# Patient Record
Sex: Female | Born: 1960 | Race: White | Hispanic: No | Smoking: Former smoker
Health system: Southern US, Community
[De-identification: ages and names within clinical notes are randomized; demographics above are authoritative.]

## PROBLEM LIST (undated history)

## (undated) DIAGNOSIS — I10 Essential (primary) hypertension: Secondary | ICD-10-CM

## (undated) DIAGNOSIS — D490 Neoplasm of unspecified behavior of digestive system: Secondary | ICD-10-CM

## (undated) DIAGNOSIS — E785 Hyperlipidemia, unspecified: Secondary | ICD-10-CM

## (undated) HISTORY — DX: Hyperlipidemia, unspecified: E78.5

## (undated) HISTORY — PX: BUNIONECTOMY: SHX129

## (undated) HISTORY — PX: ABDOMINAL HYSTERECTOMY: SHX81

---

## 2001-12-26 ENCOUNTER — Ambulatory Visit (HOSPITAL_COMMUNITY): Admission: RE | Admit: 2001-12-26 | Discharge: 2001-12-26 | Payer: Self-pay | Admitting: Pediatrics

## 2001-12-26 ENCOUNTER — Encounter: Payer: Self-pay | Admitting: Obstetrics and Gynecology

## 2005-04-12 ENCOUNTER — Observation Stay (HOSPITAL_COMMUNITY): Admission: RE | Admit: 2005-04-12 | Discharge: 2005-04-13 | Payer: Self-pay | Admitting: Obstetrics & Gynecology

## 2005-04-12 ENCOUNTER — Encounter (INDEPENDENT_AMBULATORY_CARE_PROVIDER_SITE_OTHER): Payer: Self-pay | Admitting: *Deleted

## 2007-05-12 ENCOUNTER — Ambulatory Visit (HOSPITAL_COMMUNITY): Admission: RE | Admit: 2007-05-12 | Discharge: 2007-05-12 | Payer: Self-pay | Admitting: Obstetrics and Gynecology

## 2010-04-10 ENCOUNTER — Ambulatory Visit (HOSPITAL_COMMUNITY): Admission: RE | Admit: 2010-04-10 | Discharge: 2010-04-10 | Payer: Self-pay | Admitting: Obstetrics and Gynecology

## 2010-10-26 ENCOUNTER — Emergency Department (HOSPITAL_COMMUNITY)
Admission: EM | Admit: 2010-10-26 | Discharge: 2010-10-26 | Payer: Self-pay | Source: Home / Self Care | Admitting: Emergency Medicine

## 2011-03-02 NOTE — H&P (Signed)
NAME:  [name] [name], [name] [name] NO.:  [id number]   MEDICAL RECORD NO.:  [id number]          PATIENT TYPE:  AMB   LOCATION:  SDC                           FACILITY:  [facility]   PHYSICIAN:  Luvenia Redden, M.D.   DATE OF BIRTH:  1961/06/17   DATE OF ADMISSION:  04/12/2005  DATE OF DISCHARGE:                                HISTORY & PHYSICAL   This patient is a 50 year old, gravida 1, para 1 admitted to the hospital  for a vaginal hysterectomy.   HISTORY OF PRESENT ILLNESS:  She has had a long history of abnormal uterine  bleeding which is characterized by very heavy periods, clotting, and periods  which sometimes last in excess of a week to 10 days. She was referred to [place]  by Dr. Elana Alm who previously had attempted conservative management. She had  a D&C and hysteroscopy which was done about a year ago. She was placed on  cyclic hormones. She continued to have heavy long periods. Recently she had  an endometrial biopsy which was benign. She had a pelvic ultrasound which  showed 2 or 3 small uterine myomata, one of which was impinging on the  endometrial cavity up in the fundus. She was given Lupron 11.25 mg in May of  2006 but she has continued to bleed and at times has been heavy. She was  referred to [place] for a possible hysterectomy to correct this bleeding problem.  She is now admitted for that purpose.   PAST MEDICAL HISTORY:  The patient had the usual childhood diseases, she has  had no serious medical illnesses. She had one pregnancy which progressed to  term, she has one living child. No history of bleeding disorders, previous  surgeries, psychological disorders. She does not smoke tobacco products. The  only medication she is taking at the time of admission is iron and also she  was taking some __________ when needed for heavy flow. She has no known drug  allergies except for PENICILLIN. The only medication she is currently taking  is Ortho-Cyclen to try to control her  bleeding.   FAMILY HISTORY:  There is no family history of bleeding disorders, carcinoma  or heart disease.   REVIEW OF SYMPTOMS:  __________: No complaints. CARDIORESPIRATORY:  No  complaints. GI:  No complaints. GU:  See present illness. NEUROMUSCULAR:  No  complaints.   PHYSICAL EXAMINATION:  GENERAL:  Well-developed, 50 year old female in no  acute distress.  VITAL SIGNS:  Temperature is 98.6, pulse is 76, respirations are 14 per  minute, blood pressure is 100/60.  HEAD/NECK:  Show pupils equal and reactive to light and accommodation. Ears,  nose and throat are normal. Thyroid is normal size.  HEART:  Rhythm regular, no murmurs heard.  LUNGS:  Clear to auscultation and percussion.  BREASTS:  No palpable masses.  ABDOMEN:  No palpable masses.  SKIN:  Smooth and dry.  EXTREMITIES:  Show no deformities. In her lower extremities, she has  bilateral __________ some of her digits. Peripheral pulses are present.  NEUROLOGIC:  Physiologic.  PELVIC:  External genitalia is  normal with a parous outlet. The bladder and  rectum are fairly well supported. On straining, the cervix comes down nearly  to the introitus. The cervix is smooth. Pap smear done nine months ago was  normal. The uterus itself is anterior, it is upper limits of normal size. I  cannot really feel any irregularities. There are no adnexal masses.   IMPRESSION:  1.  Multiple small uterine myomata.  2.  Menorrhagia.  3.  Anemia secondary to the menorrhagia. Last hemoglobin was 8.1 in the      office.  4.  Uterine prolapse.   PLAN:  The patient will be taken to the hospital where a vaginal  hysterectomy will be done without removing her ovaries. We plan to conserve  her ovaries unless there is some __________. She understands the possible  complications of pelvic surgery not limited to bleeding, infection, injury  to adjacent structures and death. The patient agrees to undergo this  procedure.       WSB/MEDQ  D:   04/12/2005  T:  04/12/2005  Job:  [id number]

## 2011-03-02 NOTE — Discharge Summary (Signed)
NAMETORINA, EY                ACCOUNT NO.:  [id number]   MEDICAL RECORD NO.:  [id number]          PATIENT TYPE:  OBV   LOCATION:  9305                          [facility]ACILITY:  [facility]   PHYSICIAN:  Gerrit Friends. Aldona Bar, M.D.   DATE OF BIRTH:  18-Mar-1961   DATE OF ADMISSION:  04/12/2005  DATE OF DISCHARGE:  04/13/2005                                 DISCHARGE SUMMARY   DISCHARGE DIAGNOSIS:  Menorrhagia, uterine fibroids and anemia, pathology  pending.   PROCEDURE:  Total vaginal hysterectomy.   SUMMARY:  This 50 year old patient was referred to Dr. Lodema Hong by Dr.  Kyra Manges for consideration of a vaginal hysterectomy. For details,  please see history and physical admitting note. Once the patient was seen by  Dr. Greta Doom, he asked me to carry out the vaginal hysterectomy with his  assistance. The patient's preoperative evaluation was negative with the  exception of anemia which was known and related to her bleeding. Her  admitting hemoglobin was 9.7 with a white count of 6800. Her evaluation  otherwise was unremarkable with the exception of a known fibroid uterus.   She was taken to the operating room on April 12, 2005 at which time she  underwent a total vaginal hysterectomy without incident. Her postoperative  hemoglobin was 9.9 with a white count of 11,900 and a platelet count of  427,000. At 1:00 p.m. on April 13, 2005, approximately 23 hours after  surgery, her vital signs were stable. She was comfortable. Oral analgesia  was effective. She was having bowel function and tolerating her diet well  and voiding without difficulty. She was desirous of discharge and  accordingly, was discharged to home.   DISCHARGE MEDICATIONS:  1.  Include iron one daily as she was doing preoperatively.  2.  Motrin 600 mg every six hours as needed for pain.  3.  Tylox one to two every four to six hours as needed for more severe pain.   Pathologic specimen is still pending at the time of discharge.   CONDITION ON DISCHARGE:  Improved.   FOLLOW-UP:  In the office will be carried out in approximately three weeks'  time or as needed. The patient was given all appropriate instructions at the  time of discharge.       RMW/MEDQ  D:  04/13/2005  T:  04/13/2005  Job:  [id number]   cc:   S. Kyra Manges, M.D.  [phone] [address]  [place]  [place] [address]  Fax: [phone]

## 2011-03-02 NOTE — Op Note (Signed)
[name] [name], [name] [name]                ACCOUNT NO.:  [id number]   MEDICAL RECORD NO.:  [id number]          PATIENT TYPE:  OBV   LOCATION:  9399                          [facility]   PHYSICIAN:  Gerrit Friends. Aldona Bar, M.D.   DATE OF BIRTH:  1961/02/04   DATE OF PROCEDURE:  04/12/2005  DATE OF DISCHARGE:                                 OPERATIVE REPORT   PREOPERATIVE DIAGNOSES:  1.  Menorrhagia.  2.  Fibroids.  3.  Anemia   POSTOPERATIVE DIAGNOSES:  1.  Menorrhagia.  2.  Fibroids.  3.  Anemia  4.  Pathology pending.   PROCEDURE:  Total vaginal hysterectomy.   SURGEON:  Gerrit Friends. Aldona Bar, M.D.   ASSISTANT:  Dr. Lodema Hong.   ANESTHESIA:  General endotracheal.   PROCEDURE:  The patient was taken to the operating room where after  satisfactory induction of general endotracheal anesthesia, she was prepped  and draped having been placed in the lithotomy position in the short Allen  stirrups.  The bladder was drained of clear urine with a red rubber catheter  in in-and-out fashion as part of the prep.   After the patient was adequately draped, the procedure was begun.  A  posterior weighted speculum was placed and a tenaculum placed on the cervix.  The cervix pulled down easily to almost the level of the introitus.  Thereafter the cervix was circumferentially injected with a dilute solution  of Neo-Synephrine for hemostasis.  Thereafter the cervix and the vagina were  sharply separated by using a knife in a circumferential fashion.  Posterior  peritoneum was identified and entered appropriately.  The back of the uterus  was palpated.  There were some fibroids noted in the fundal region.  At this  time the uterosacral pedicles were taken using curved Heaney clamps and  suture ligature of - Vicryl suture.  Additional parametrial bites were taken bilaterally in a similar fashion.  The anterior flap was dissected somewhat, and at this time the uterine  artery pedicles were clamped, cut and  suture secured with 0 Vicryl suture  bilaterally.  An additional parametrial bite was taken bilaterally and then  with relative ease, the anterior peritoneum was entered and bowel was seen  appropriately.  At this time an additional parametrial pedicle was taken  bilaterally and specimen thereafter inverted.  The ovarian pedicles were  then clamped, cut, specimen removed, and the ovarian pedicles were doubly  suture secured with 0 Vicryl suture.  Hemostasis was noted to be adequate  with the exception of the anterior and posterior cuff.  All pedicles seemed  secure.   At this time the posterior cuff was secured using a running locking suture  from uterosacral to uterosacral.  Thereafter the peritoneum was closed in a  pursestring fashion, extraperitonealizing all vascular pedicles with the  exception of the ovarian pedicles, which were cut free.  At this time  closure of vaginal cuff was carried out in a vertical fashion using  interrupted 0 Vicryl suture.  It is of note that prior to closure of the  peritoneum, the bladder  was drained of clear urine with a catheter.   After the vaginal cuff was closed in vertical fashion using interrupted 0  Vicryl suture, the procedure was felt to be complete and was terminated.  Estimated blood loss of approximately 150 mL.  All counts correct x2.  The  patient was transported to the recovery room in satisfactory, having  tolerated well.   In summary, this patient presented with menorrhagia, anemia and a fibroid  uterus and after appropriate workup was deemed an excellent candidate for a  total vaginal hysterectomy, which was carried out without difficulty.  Pathologic findings consisted of multiple fibroids, microscopic findings  pending.       RMW/MEDQ  D:  04/12/2005  T:  04/12/2005  Job:  [id number]

## 2011-03-02 NOTE — Discharge Summary (Signed)
NAMEMARNE, [name]                ACCOUNT NO.:  [id number]   MEDICAL RECORD NO.:  [id number]          PATIENT TYPE:  OBV   LOCATION:  9305                          [facility]   PHYSICIAN:  Gerrit Friends. Aldona Bar, M.D.   DATE OF BIRTH:  03-27-1961   DATE OF ADMISSION:  04/12/2005  DATE OF DISCHARGE:                                 DISCHARGE SUMMARY   Audio too short to transcribe (less than 5 seconds)       RMW/MEDQ  D:  04/13/2005  T:  04/13/2005  Job:  [id number]

## 2011-09-17 ENCOUNTER — Other Ambulatory Visit (HOSPITAL_COMMUNITY): Payer: Self-pay | Admitting: Family Medicine

## 2011-09-17 DIAGNOSIS — Z139 Encounter for screening, unspecified: Secondary | ICD-10-CM

## 2011-11-12 ENCOUNTER — Ambulatory Visit (HOSPITAL_COMMUNITY)
Admission: RE | Admit: 2011-11-12 | Discharge: 2011-11-12 | Disposition: A | Discharge status: Home / Self Care | Payer: 59 | Source: Ambulatory Visit | Attending: Family Medicine | Admitting: Family Medicine

## 2011-11-12 DIAGNOSIS — Z1231 Encounter for screening mammogram for malignant neoplasm of breast: Secondary | ICD-10-CM | POA: Insufficient documentation

## 2011-11-12 DIAGNOSIS — Z139 Encounter for screening, unspecified: Secondary | ICD-10-CM

## 2013-03-10 ENCOUNTER — Other Ambulatory Visit (HOSPITAL_COMMUNITY)
Admission: RE | Admit: 2013-03-10 | Discharge: 2013-03-10 | Disposition: A | Discharge status: Home / Self Care | Payer: BC Managed Care – PPO | Source: Ambulatory Visit | Attending: Otolaryngology | Admitting: Otolaryngology

## 2013-03-10 DIAGNOSIS — K119 Disease of salivary gland, unspecified: Secondary | ICD-10-CM | POA: Insufficient documentation

## 2013-03-11 ENCOUNTER — Other Ambulatory Visit: Payer: Self-pay | Admitting: Otolaryngology

## 2013-03-11 DIAGNOSIS — D37039 Neoplasm of uncertain behavior of the major salivary glands, unspecified: Secondary | ICD-10-CM

## 2013-03-17 ENCOUNTER — Ambulatory Visit
Admission: RE | Admit: 2013-03-17 | Discharge: 2013-03-17 | Disposition: A | Discharge status: Home / Self Care | Payer: BC Managed Care – PPO | Source: Ambulatory Visit | Attending: Otolaryngology | Admitting: Otolaryngology

## 2013-03-17 DIAGNOSIS — D37039 Neoplasm of uncertain behavior of the major salivary glands, unspecified: Secondary | ICD-10-CM

## 2013-03-17 MED ORDER — IOHEXOL 300 MG/ML  SOLN
75.0000 mL | Freq: Once | INTRAMUSCULAR | Status: AC | PRN
  Administered 2013-03-17: 75 mL via INTRAVENOUS

## 2013-04-10 ENCOUNTER — Other Ambulatory Visit (HOSPITAL_COMMUNITY): Payer: Self-pay | Admitting: Family Medicine

## 2013-04-10 DIAGNOSIS — Z139 Encounter for screening, unspecified: Secondary | ICD-10-CM

## 2013-05-01 ENCOUNTER — Ambulatory Visit (HOSPITAL_COMMUNITY)
Admission: RE | Admit: 2013-05-01 | Discharge: 2013-05-01 | Disposition: A | Discharge status: Home / Self Care | Payer: BC Managed Care – PPO | Source: Ambulatory Visit | Attending: Family Medicine | Admitting: Family Medicine

## 2013-05-01 DIAGNOSIS — Z139 Encounter for screening, unspecified: Secondary | ICD-10-CM

## 2013-05-01 DIAGNOSIS — Z1231 Encounter for screening mammogram for malignant neoplasm of breast: Secondary | ICD-10-CM | POA: Insufficient documentation

## 2014-01-01 ENCOUNTER — Ambulatory Visit: Payer: Self-pay

## 2014-01-04 ENCOUNTER — Ambulatory Visit: Payer: Self-pay

## 2014-01-08 ENCOUNTER — Ambulatory Visit (INDEPENDENT_AMBULATORY_CARE_PROVIDER_SITE_OTHER): Payer: BC Managed Care – PPO

## 2014-01-08 VITALS — BP 136/81 | HR 63 | Resp 16 | Ht 62.0 in | Wt 140.0 lb

## 2014-01-08 DIAGNOSIS — M2010 Hallux valgus (acquired), unspecified foot: Secondary | ICD-10-CM

## 2014-01-08 DIAGNOSIS — M79673 Pain in unspecified foot: Secondary | ICD-10-CM

## 2014-01-08 DIAGNOSIS — B351 Tinea unguium: Secondary | ICD-10-CM

## 2014-01-08 DIAGNOSIS — M79609 Pain in unspecified limb: Secondary | ICD-10-CM

## 2014-01-08 DIAGNOSIS — M779 Enthesopathy, unspecified: Secondary | ICD-10-CM

## 2014-01-08 DIAGNOSIS — Z79899 Other long term (current) drug therapy: Secondary | ICD-10-CM

## 2014-01-08 MED ORDER — TERBINAFINE HCL 250 MG PO TABS: 250.0000 mg | ORAL_TABLET | Freq: Every day | ORAL | Status: DC

## 2014-01-08 NOTE — Patient Instructions (Signed)

## 2014-01-08 NOTE — Progress Notes (Signed)
   Subjective:    Patient ID: [name] [name], female    DOB: 1961-03-27, 53 y.o.   MRN: [id number]  HPI Comments: "I have nail fungus and the right foot hurts"  Patient c/o toenail discoloration and thickening bilateral toenails for a few years. Her PCP Rx'd Lamisil about 1 year ago and took 3 months worth. She hasn't seen too much improvements.  Also, c/o aching 1st MPJ right foot for several years, just recently more painful. The area does feel swollen sometimes. Shoes can be uncomfortable.     Review of Systems  All other systems reviewed and are negative.       Objective:   Physical Exam Neurovascular status is intact pedal pulses palpable DP and PT posterior were for Refill timed 3-4 seconds skin temperature warm turgor normal no edema rubor or varicosities noted neurologically epicritic and proprioceptive sensations intact and symmetric bilateral. Orthopedic biomechanical exam reveals notable bunion deformity with lateral deviation of hallux I am angle 13 on x-ray sesamoid position 6 with adductovarus rotated hammertoes 2 through 5 on the right foot as well. Left foot has bunion although not as severe. Patient also has a congenital deformities of both hands that are noted there deformities noted on the foot this time. no open wounds ulcerations patient also recently treated the past year with topical in the oral antifungal lamisil for fungus nails are starting to have improvement palmar stop the medication and since then the nails cut again risks resumed having fungus and white discoloration patient is interested in resuming her medication treatment for fungus with lamisil. Discussed possibly the topical however patient patient is not able to topical and she has to get that scheduled time following through on the As far as the bunion goes it has some mild pain tenderness I was worse and last for weakness and some of them they have worsened different shoes and works as a Electrical engineer and  may been excessively working for couple days aggravated her foot since this time is calm down and she wearing more comminuted shoe.      Assessment & Plan:  Assessment this time hallux abductovalgus deformity First MTP Area Literature about Bunions and Possible Bunion Surgery Correction Is Given in the Interim Use NSAIDs and Accommodative Shoe Therapies. Is Also Given a Prescription Called in for Lamisil Are Benefiting 20 Mg Tablets 1 Daily for 83 Months Duration However [facility] to Be Done within 30 Days for Hepatic Functions to Assess the Lamisil Special Side Effects or Liver. Will Recheck in 3-6 Months for Nail Check in 10 Growth Monitoring for Improvement May Be K. for Booster Dose of 6 Months. Patient Will Find Contact us at [facility] Future When She Is Ready for Bunion Surgery If Symptoms Recur  Harriet Masson DPM

## 2014-07-20 ENCOUNTER — Other Ambulatory Visit (HOSPITAL_COMMUNITY): Payer: Self-pay | Admitting: Family Medicine

## 2014-07-20 DIAGNOSIS — Z1231 Encounter for screening mammogram for malignant neoplasm of breast: Secondary | ICD-10-CM

## 2014-08-13 ENCOUNTER — Ambulatory Visit (HOSPITAL_COMMUNITY)
Admission: RE | Admit: 2014-08-13 | Discharge: 2014-08-13 | Disposition: A | Discharge status: Home / Self Care | Payer: 59 | Source: Ambulatory Visit | Attending: Family Medicine | Admitting: Family Medicine

## 2014-08-13 DIAGNOSIS — Z1231 Encounter for screening mammogram for malignant neoplasm of breast: Secondary | ICD-10-CM | POA: Diagnosis not present

## 2014-08-16 ENCOUNTER — Other Ambulatory Visit: Payer: Self-pay | Admitting: Family Medicine

## 2014-08-16 DIAGNOSIS — R928 Other abnormal and inconclusive findings on diagnostic imaging of breast: Secondary | ICD-10-CM

## 2014-08-31 ENCOUNTER — Ambulatory Visit (HOSPITAL_COMMUNITY)
Admission: RE | Admit: 2014-08-31 | Discharge: 2014-08-31 | Disposition: A | Discharge status: Home / Self Care | Payer: 59 | Source: Ambulatory Visit | Attending: Family Medicine | Admitting: Family Medicine

## 2014-08-31 DIAGNOSIS — R928 Other abnormal and inconclusive findings on diagnostic imaging of breast: Secondary | ICD-10-CM | POA: Insufficient documentation

## 2015-07-04 DIAGNOSIS — L821 Other seborrheic keratosis: Secondary | ICD-10-CM | POA: Insufficient documentation

## 2015-07-04 DIAGNOSIS — M545 Low back pain, unspecified: Secondary | ICD-10-CM | POA: Insufficient documentation

## 2015-07-04 DIAGNOSIS — Z23 Encounter for immunization: Secondary | ICD-10-CM | POA: Insufficient documentation

## 2015-07-04 DIAGNOSIS — M279 Disease of jaws, unspecified: Secondary | ICD-10-CM | POA: Insufficient documentation

## 2015-07-04 DIAGNOSIS — F17210 Nicotine dependence, cigarettes, uncomplicated: Secondary | ICD-10-CM | POA: Insufficient documentation

## 2015-07-04 DIAGNOSIS — L918 Other hypertrophic disorders of the skin: Secondary | ICD-10-CM | POA: Insufficient documentation

## 2015-07-04 DIAGNOSIS — Z79890 Hormone replacement therapy (postmenopausal): Secondary | ICD-10-CM | POA: Insufficient documentation

## 2015-07-04 DIAGNOSIS — F419 Anxiety disorder, unspecified: Secondary | ICD-10-CM | POA: Insufficient documentation

## 2015-07-04 DIAGNOSIS — M722 Plantar fascial fibromatosis: Secondary | ICD-10-CM | POA: Insufficient documentation

## 2015-07-04 DIAGNOSIS — B351 Tinea unguium: Secondary | ICD-10-CM | POA: Insufficient documentation

## 2015-07-12 IMAGING — MG MM DIGITAL DIAGNOSTIC UNILAT R
2 series · 2 of 2 positions shown · non-contrast
Comparison: 08/13/2014 and earlier

CLINICAL DATA: The patient returns after screening study for
evaluation of the right breast.

EXAM:
DIGITAL DIAGNOSTIC  RIGHT MAMMOGRAM WITH CAD

[R MLO]
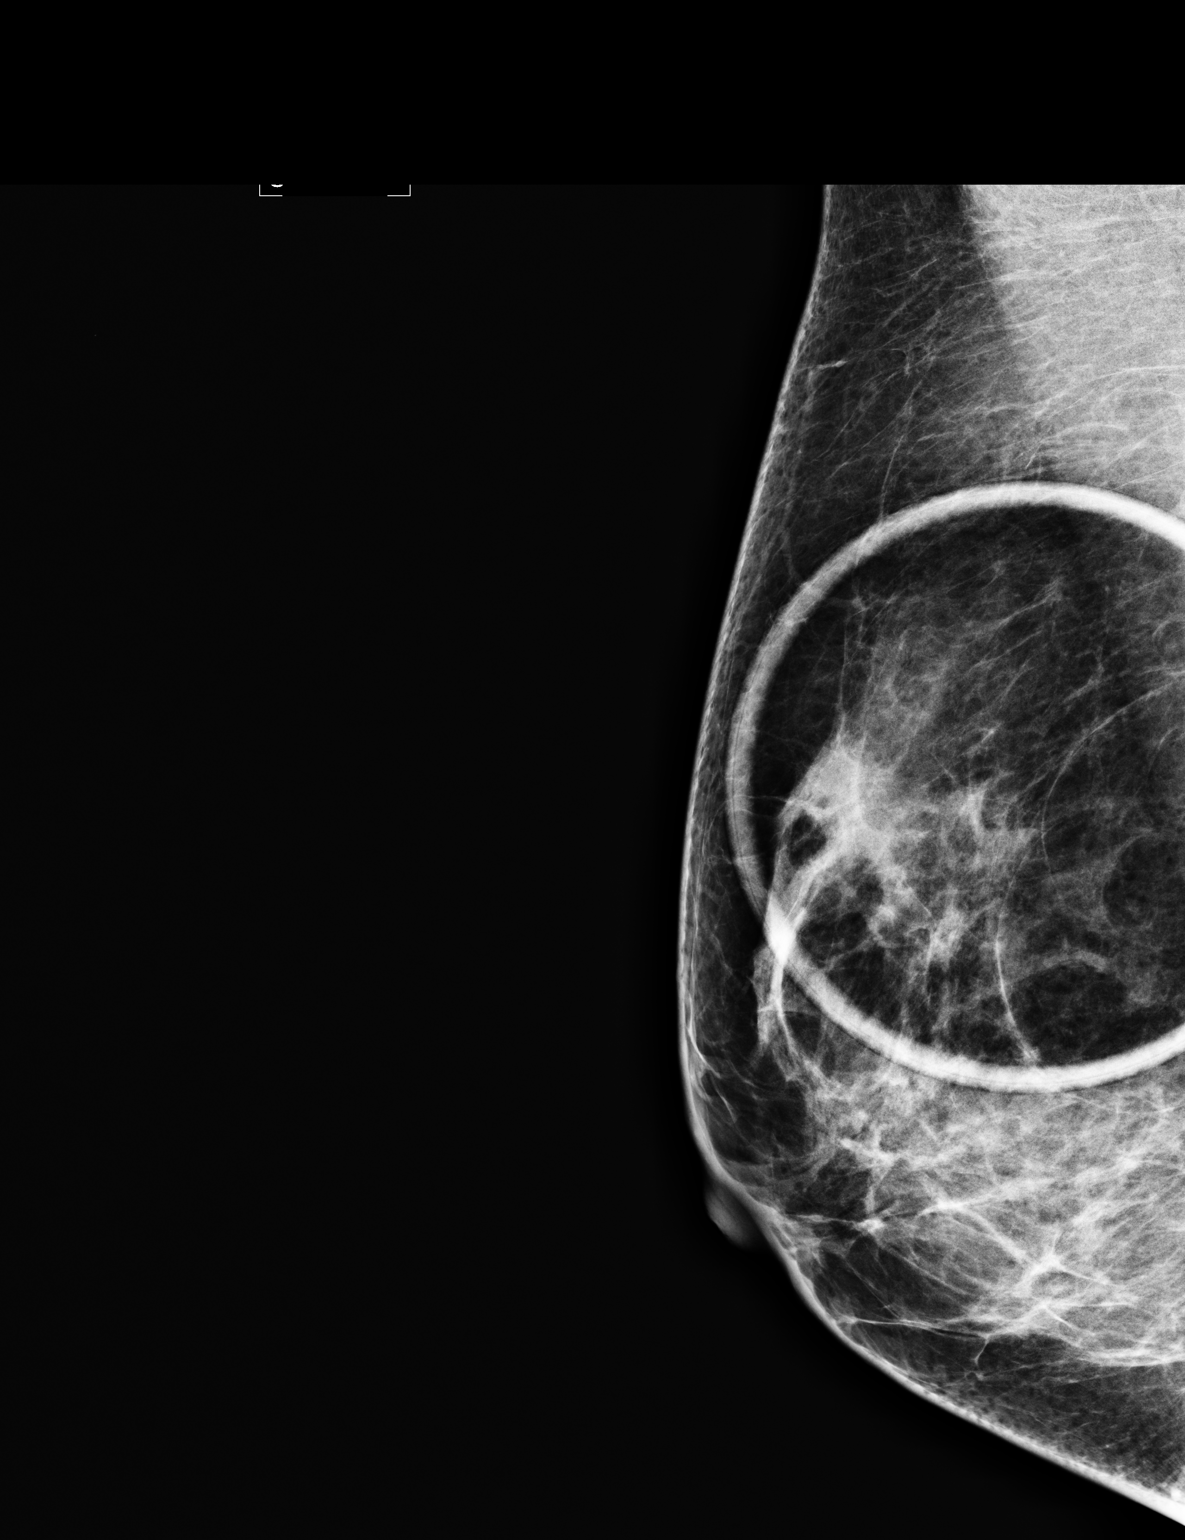

[R ML]
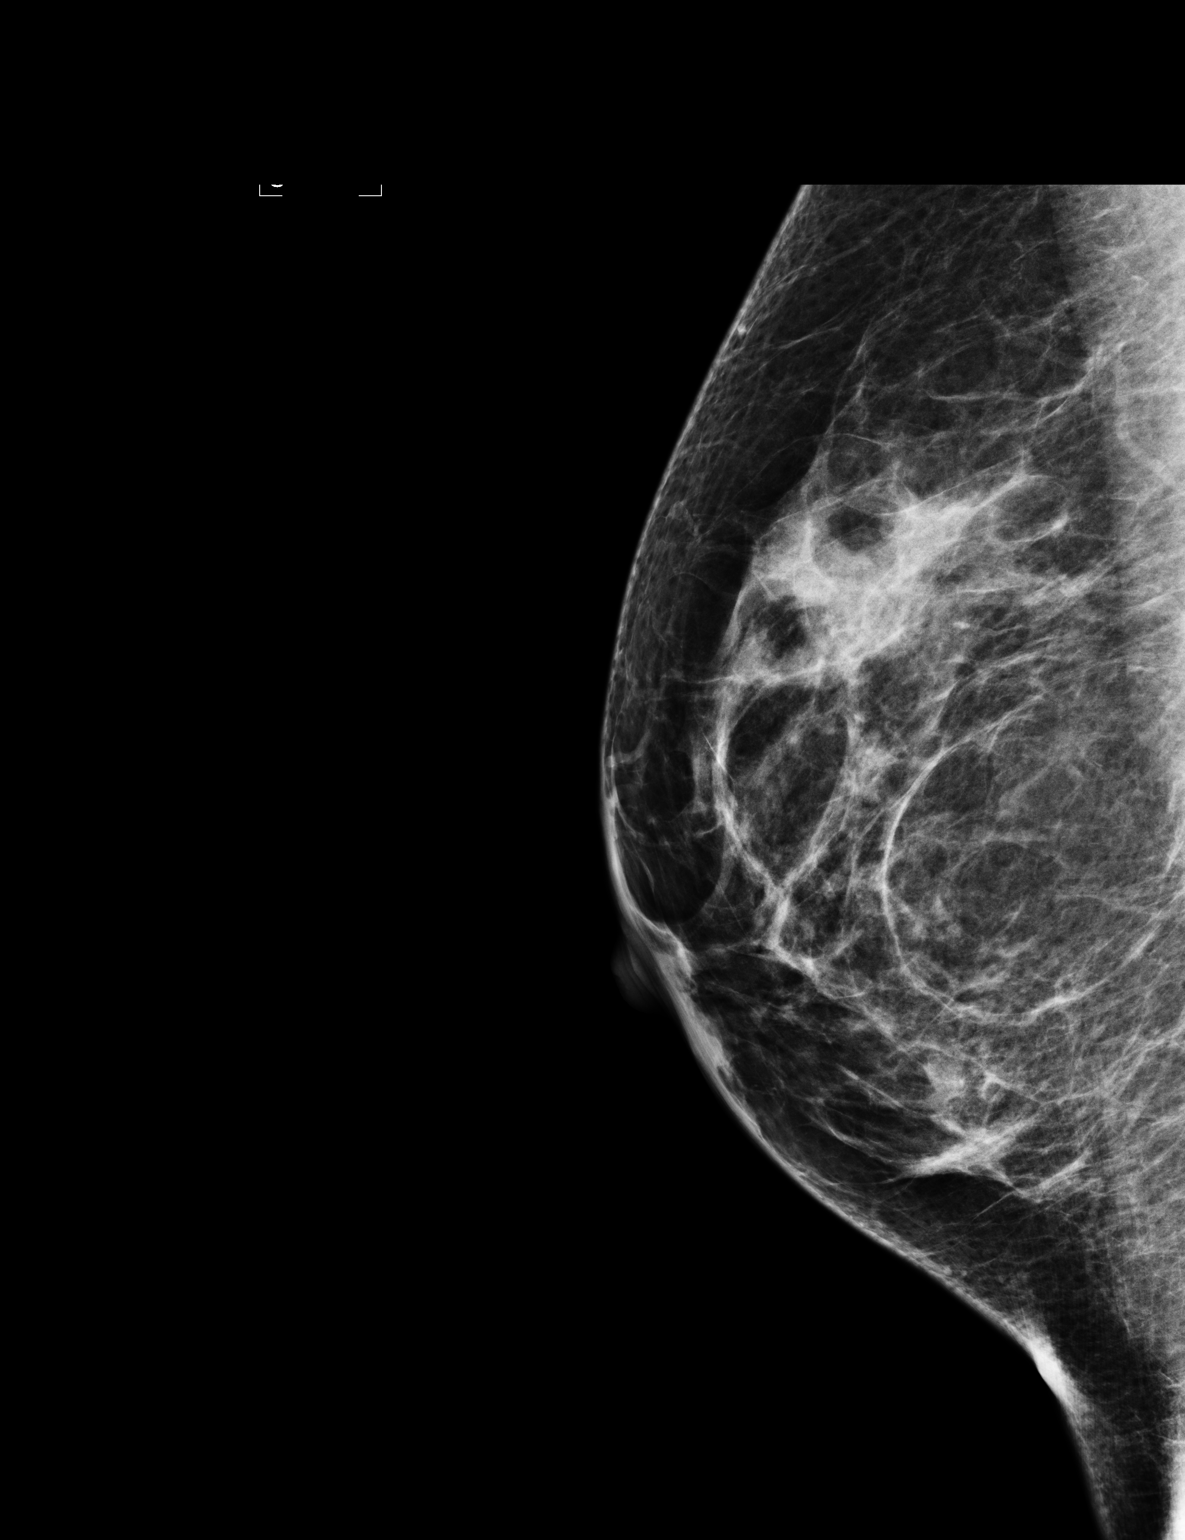

[2 of 2 positions shown; findings below may reference images not displayed]

ACR Breast Density Category c: The breast tissue is heterogeneously
dense, which may obscure small masses.
FINDINGS: Additional views are performed showing no persistent abnormality in
the right breast.

Mammographic images were processed with CAD.
IMPRESSION: No mammographic evidence for malignancy.

RECOMMENDATION:
Screening mammogram in one year.(Code:NQ-6-QOR)

I have discussed the findings and recommendations with the patient.
Results were also provided in writing at the conclusion of the
visit. If applicable, a reminder letter will be sent to the patient
regarding the next appointment.

BI-RADS CATEGORY  1: Negative.

## 2015-08-02 ENCOUNTER — Other Ambulatory Visit (HOSPITAL_COMMUNITY): Payer: Self-pay | Admitting: Family Medicine

## 2015-08-02 DIAGNOSIS — Z1231 Encounter for screening mammogram for malignant neoplasm of breast: Secondary | ICD-10-CM

## 2015-08-23 ENCOUNTER — Encounter (HOSPITAL_BASED_OUTPATIENT_CLINIC_OR_DEPARTMENT_OTHER): Payer: Self-pay | Admitting: *Deleted

## 2015-08-24 ENCOUNTER — Other Ambulatory Visit: Payer: Self-pay | Admitting: Otolaryngology

## 2015-08-24 NOTE — H&P (Signed)
[name] [name], [name] [name] 54 y.o., female [id number]     Chief Complaint: LEFT parotid mass  HPI: 54 year old white female has noticed a lump in front of her LEFT ear now for perhaps 2 years. It seems to be growing slowly. It is tender when she palpates it but not otherwise.  No trismus. It does not swell with meals. No prior trauma to the area. She thought she might have a "ganglion cyst". No ear pain or drainage. No other neck masses. No masses in axillae or groins.  No prior evaluation or any imaging studies.  Spoke to patient told her per Dr Constance Holster that her FNA showed benign salivary gland.  Told her that Dr Erik Obey will review this also when he comes in.  Patient understood.    2-1/2 years return visit for this 54 year old white female.  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  For personal reasons, she was unable to come back in for parotidectomy.  She is back now for further consideration.  The mass may have grown slightly by her estimation.  It does not hurt.  It does not swell with meals.  No other salivary gland masses, or neck masses.  No difficulty breathing, eating, or swallowing.  She does continue to smoke.  Preoperative visit.  We are preparing to do a LEFT superficial parotidectomy for presumed benign neoplasm next week.  I discussed this with her in detail including risks and complications.  Questions were answered and informed consent obtained.  I discussed advancement of diet and activity including return to work and activities.  I gave her a prescription for Vicodin 5 mg for postoperative pain.  I will see her back here 10 days after surgery.  PMH: Past Medical History  Diagnosis Date  . Parotid neoplasm     left    Surg Hx: Past Surgical History  Procedure Laterality Date  . Abdominal hysterectomy      partial    FHx:  No family history on file. SocHx:  reports that she quit smoking yesterday. Her smoking use included Cigarettes. She  has a 17.5 pack-year smoking history. She does not have any smokeless tobacco history on file. She reports that she drinks alcohol. She reports that she does not use illicit drugs.  ALLERGIES:  Allergies  Allergen Reactions  . Penicillins Hives     (Not in a hospital admission)  No results found for this or any previous visit (from the past 48 hour(s)). No results found.  VOH:YWVPXTGG: Not feeling tired (fatigue).  No fever, no night sweats, and no recent weight loss. Head: No headache. Eyes: No eye symptoms. Otolaryngeal: No hearing loss, no earache, no tinnitus, and no purulent nasal discharge.  No nasal passage blockage (stuffiness), no snoring, no sneezing, no hoarseness, and no sore throat. Cardiovascular: No chest pain or discomfort  and no palpitations. Pulmonary: No dyspnea, no cough, and no wheezing. Gastrointestinal: No dysphagia  and no heartburn.  No nausea, no abdominal pain, and no melena.  No diarrhea. Genitourinary: No dysuria. Endocrine: No muscle weakness. Musculoskeletal: No calf muscle cramps, no arthralgias, and no soft tissue swelling. Neurological: No dizziness, no fainting, no tingling, and no numbness. Psychological: No anxiety  and no depression. Skin: No rash.  BP:146/87,  HR: 70 b/min,  Height: 62 in, Weight: 133.5 lb, BMI: 24.4 kg/m2   PHYSICAL EXAM: She is trim and elevated.  Mental status is appropriate.  She hears well in conversational speech.  Voice  is clear and respirations unlabored through the nose.  The head is atraumatic and neck supple.  Cranial nerves intact, including facial nerve.  Ear canals are clear with normal drums.  Anterior nose is moist and patent.  Oral cavity is moist with teeth in good repair.  Oropharynx clear.  Neck is remarkable for a 1.5 cm firm and mobile mass in the LEFT mid parotid.  No other neck masses.  No adenopathy.  Lungs: Clear to auscultation Heart: Regular rate and rhythm Abdomen: Soft, active Extremities:  Normal configuration Neurologic: Symmetric, grossly intact.  Studies Reviewed:  FNA LEFT parotid lesion    Assessment/Plan Cigarette nicotine dependence without complication (161.0) (R60.454). 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  We're going to remove your LEFT parotid tumor.  You will stay overnight one night.  I would like you to buy a small bottle of Hibiclens (chlorhexidine) surgical scrub solution and wash your face and neck with this the night before surgery and again the morning of surgery.  No strenuous activity for 2 weeks after surgery.  You are okay to go back to work one week after surgery.  I will see you back here for a postop check 10 days after surgery.  Hydrocodone-Acetaminophen 5-325 MG Oral Tablet;1-2 po q4-6h prn pain; [id number]; R0; Rx.  [name] 56/01/7828, 12:37 PM

## 2015-08-29 ENCOUNTER — Ambulatory Visit (HOSPITAL_BASED_OUTPATIENT_CLINIC_OR_DEPARTMENT_OTHER): Payer: 59 | Admitting: Anesthesiology

## 2015-08-29 ENCOUNTER — Other Ambulatory Visit: Payer: Self-pay | Admitting: Otolaryngology

## 2015-08-29 ENCOUNTER — Inpatient Hospital Stay (HOSPITAL_COMMUNITY): Payer: 59 | Admitting: Anesthesiology

## 2015-08-29 ENCOUNTER — Encounter (HOSPITAL_COMMUNITY)
Admission: EM | Disposition: A | Discharge status: Home / Self Care | Payer: Self-pay | Source: Ambulatory Visit | Attending: Otolaryngology

## 2015-08-29 ENCOUNTER — Observation Stay (HOSPITAL_COMMUNITY)
Admission: EM | Admit: 2015-08-29 | Discharge: 2015-08-30 | Disposition: A | Discharge status: Home / Self Care | Payer: 59 | Source: Ambulatory Visit | Attending: Otolaryngology | Admitting: Otolaryngology

## 2015-08-29 ENCOUNTER — Encounter (HOSPITAL_BASED_OUTPATIENT_CLINIC_OR_DEPARTMENT_OTHER)
Admission: RE | Disposition: A | Discharge status: Home / Self Care | Payer: Self-pay | Source: Ambulatory Visit | Attending: Otolaryngology

## 2015-08-29 ENCOUNTER — Ambulatory Visit (HOSPITAL_BASED_OUTPATIENT_CLINIC_OR_DEPARTMENT_OTHER)
Admission: RE | Admit: 2015-08-29 | Discharge: 2015-08-29 | Disposition: A | Discharge status: Home / Self Care | Payer: 59 | Source: Ambulatory Visit | Attending: Otolaryngology | Admitting: Otolaryngology

## 2015-08-29 ENCOUNTER — Encounter (HOSPITAL_BASED_OUTPATIENT_CLINIC_OR_DEPARTMENT_OTHER): Payer: Self-pay | Admitting: Anesthesiology

## 2015-08-29 DIAGNOSIS — Z880 Allergy status to penicillin: Secondary | ICD-10-CM | POA: Insufficient documentation

## 2015-08-29 DIAGNOSIS — F17210 Nicotine dependence, cigarettes, uncomplicated: Secondary | ICD-10-CM | POA: Insufficient documentation

## 2015-08-29 DIAGNOSIS — D490 Neoplasm of unspecified behavior of digestive system: Secondary | ICD-10-CM | POA: Diagnosis present

## 2015-08-29 DIAGNOSIS — E89820 Postprocedural hematoma of an endocrine system organ or structure following an endocrine system procedure: Secondary | ICD-10-CM | POA: Diagnosis not present

## 2015-08-29 DIAGNOSIS — Y838 Other surgical procedures as the cause of abnormal reaction of the patient, or of later complication, without mention of misadventure at the time of the procedure: Secondary | ICD-10-CM | POA: Insufficient documentation

## 2015-08-29 DIAGNOSIS — D110 Benign neoplasm of parotid gland: Principal | ICD-10-CM | POA: Insufficient documentation

## 2015-08-29 DIAGNOSIS — Z419 Encounter for procedure for purposes other than remedying health state, unspecified: Secondary | ICD-10-CM

## 2015-08-29 HISTORY — DX: Neoplasm of unspecified behavior of digestive system: D49.0

## 2015-08-29 HISTORY — PX: PAROTIDECTOMY: SHX2163

## 2015-08-29 SURGERY — EXCISION, PAROTID GLAND
Anesthesia: General | Site: Face | Laterality: Left

## 2015-08-29 SURGERY — EXCISION, PAROTID GLAND
Anesthesia: General | Site: Neck | Laterality: Left

## 2015-08-29 MED ORDER — LIDOCAINE HCL (CARDIAC) 20 MG/ML IV SOLN
INTRAVENOUS | Status: DC | PRN
  Administered 2015-08-29: 50 mg via INTRAVENOUS

## 2015-08-29 MED ORDER — MIDAZOLAM HCL 5 MG/5ML IJ SOLN
INTRAMUSCULAR | Status: DC | PRN
  Administered 2015-08-29: 2 mg via INTRAVENOUS

## 2015-08-29 MED ORDER — FENTANYL CITRATE (PF) 100 MCG/2ML IJ SOLN
INTRAMUSCULAR | Status: AC
  Filled 2015-08-29: qty 4

## 2015-08-29 MED ORDER — ACETAMINOPHEN 325 MG PO TABS: 650.0000 mg | ORAL_TABLET | ORAL | Status: DC | PRN

## 2015-08-29 MED ORDER — LIDOCAINE-EPINEPHRINE 1 %-1:100000 IJ SOLN
INTRAMUSCULAR | Status: AC
  Filled 2015-08-29: qty 1

## 2015-08-29 MED ORDER — PHENYLEPHRINE HCL 10 MG/ML IJ SOLN
INTRAMUSCULAR | Status: DC | PRN
  Administered 2015-08-29 (×3): 40 ug via INTRAVENOUS

## 2015-08-29 MED ORDER — LACTATED RINGERS IV SOLN
INTRAVENOUS | Status: DC
  Administered 2015-08-29: 16:00:00 via INTRAVENOUS

## 2015-08-29 MED ORDER — GLYCOPYRROLATE 0.2 MG/ML IJ SOLN
INTRAMUSCULAR | Status: DC | PRN
  Administered 2015-08-29: 0.2 mg via INTRAVENOUS

## 2015-08-29 MED ORDER — ONDANSETRON HCL 4 MG/2ML IJ SOLN
INTRAMUSCULAR | Status: DC | PRN
  Administered 2015-08-29: 4 mg via INTRAVENOUS

## 2015-08-29 MED ORDER — DEXTROSE-NACL 5-0.45 % IV SOLN
INTRAVENOUS | Status: DC
  Administered 2015-08-29: 20:00:00 via INTRAVENOUS

## 2015-08-29 MED ORDER — DEXTROSE-NACL 5-0.45 % IV SOLN
INTRAVENOUS | Status: DC
  Administered 2015-08-29: 13:00:00 via INTRAVENOUS

## 2015-08-29 MED ORDER — MENTHOL 3 MG MT LOZG
1.0000 | LOZENGE | OROMUCOSAL | Status: DC | PRN
  Administered 2015-08-29: 3 mg via ORAL
  Filled 2015-08-29: qty 9

## 2015-08-29 MED ORDER — DEXAMETHASONE SODIUM PHOSPHATE 4 MG/ML IJ SOLN
INTRAMUSCULAR | Status: DC | PRN
  Administered 2015-08-29: 10 mg via INTRAVENOUS

## 2015-08-29 MED ORDER — LACTATED RINGERS IV SOLN
INTRAVENOUS | Status: DC
  Administered 2015-08-29 (×2): via INTRAVENOUS

## 2015-08-29 MED ORDER — SUCCINYLCHOLINE CHLORIDE 20 MG/ML IJ SOLN
INTRAMUSCULAR | Status: AC
  Filled 2015-08-29: qty 1

## 2015-08-29 MED ORDER — PROMETHAZINE HCL 25 MG PO TABS
25.0000 mg | ORAL_TABLET | Freq: Four times a day (QID) | ORAL | Status: DC | PRN
  Administered 2015-08-30: 25 mg via ORAL
  Filled 2015-08-29: qty 1

## 2015-08-29 MED ORDER — BACITRACIN ZINC 500 UNIT/GM EX OINT
TOPICAL_OINTMENT | CUTANEOUS | Status: AC
  Filled 2015-08-29: qty 28.35

## 2015-08-29 MED ORDER — IBUPROFEN 100 MG/5ML PO SUSP: 400.0000 mg | Freq: Four times a day (QID) | ORAL | Status: DC | PRN

## 2015-08-29 MED ORDER — PROPOFOL 10 MG/ML IV BOLUS
INTRAVENOUS | Status: AC
  Filled 2015-08-29: qty 20

## 2015-08-29 MED ORDER — SUCCINYLCHOLINE CHLORIDE 20 MG/ML IJ SOLN
INTRAMUSCULAR | Status: DC | PRN
  Administered 2015-08-29: 50 mg via INTRAVENOUS

## 2015-08-29 MED ORDER — BACITRACIN ZINC 500 UNIT/GM EX OINT
1.0000 "application " | TOPICAL_OINTMENT | Freq: Three times a day (TID) | CUTANEOUS | Status: DC
  Administered 2015-08-29 – 2015-08-30 (×3): 1 via TOPICAL
  Filled 2015-08-29: qty 28.35

## 2015-08-29 MED ORDER — OXYCODONE HCL 5 MG PO TABS
5.0000 mg | ORAL_TABLET | ORAL | Status: DC | PRN
  Administered 2015-08-29 – 2015-08-30 (×2): 10 mg via ORAL
  Filled 2015-08-29 (×2): qty 2

## 2015-08-29 MED ORDER — PHENYLEPHRINE HCL 10 MG/ML IJ SOLN
INTRAMUSCULAR | Status: DC | PRN
  Administered 2015-08-29: 40 ug via INTRAVENOUS

## 2015-08-29 MED ORDER — HYDROCORTISONE 1 % EX CREA
1.0000 "application " | TOPICAL_CREAM | Freq: Three times a day (TID) | CUTANEOUS | Status: DC | PRN
  Filled 2015-08-29: qty 28

## 2015-08-29 MED ORDER — MIDAZOLAM HCL 2 MG/2ML IJ SOLN: 1.0000 mg | INTRAMUSCULAR | Status: DC | PRN

## 2015-08-29 MED ORDER — PROMETHAZINE HCL 25 MG/ML IJ SOLN: 6.2500 mg | INTRAMUSCULAR | Status: DC | PRN

## 2015-08-29 MED ORDER — HYDROCODONE-ACETAMINOPHEN 7.5-325 MG PO TABS: 1.0000 | ORAL_TABLET | Freq: Once | ORAL | Status: DC | PRN

## 2015-08-29 MED ORDER — CLINDAMYCIN PHOSPHATE 900 MG/50ML IV SOLN
900.0000 mg | Freq: Once | INTRAVENOUS | Status: AC
  Administered 2015-08-29: 900 mg via INTRAVENOUS

## 2015-08-29 MED ORDER — CLINDAMYCIN PHOSPHATE 900 MG/50ML IV SOLN
INTRAVENOUS | Status: DC | PRN
  Administered 2015-08-29: 900 mg via INTRAVENOUS

## 2015-08-29 MED ORDER — PRAMOXINE-ZINC OXIDE IN MO 1-12.5 % RE OINT
1.0000 | TOPICAL_OINTMENT | Freq: Three times a day (TID) | RECTAL | Status: DC | PRN
Start: 2015-08-29 — End: 2015-08-30
  Filled 2015-08-29: qty 28.3

## 2015-08-29 MED ORDER — NEOSTIGMINE METHYLSULFATE 10 MG/10ML IV SOLN
INTRAVENOUS | Status: DC | PRN
  Administered 2015-08-29: 3 mg via INTRAVENOUS

## 2015-08-29 MED ORDER — KETOROLAC TROMETHAMINE 30 MG/ML IJ SOLN: 30.0000 mg | Freq: Once | INTRAMUSCULAR | Status: DC

## 2015-08-29 MED ORDER — GUAIFENESIN-DM 100-10 MG/5ML PO SYRP: 10.0000 mL | ORAL_SOLUTION | ORAL | Status: DC | PRN

## 2015-08-29 MED ORDER — CLINDAMYCIN PHOSPHATE 900 MG/50ML IV SOLN
900.0000 mg | INTRAVENOUS | Status: DC
  Filled 2015-08-29: qty 50

## 2015-08-29 MED ORDER — LIDOCAINE HCL (CARDIAC) 20 MG/ML IV SOLN
INTRAVENOUS | Status: DC | PRN
  Administered 2015-08-29: 100 mg via INTRAVENOUS
  Administered 2015-08-29: 60 mg via INTRAVENOUS

## 2015-08-29 MED ORDER — FENTANYL CITRATE (PF) 100 MCG/2ML IJ SOLN
INTRAMUSCULAR | Status: DC | PRN
  Administered 2015-08-29: 100 ug via INTRAVENOUS
  Administered 2015-08-29 (×3): 50 ug via INTRAVENOUS

## 2015-08-29 MED ORDER — MENTHOL 3 MG MT LOZG: 1.0000 | LOZENGE | OROMUCOSAL | Status: DC | PRN

## 2015-08-29 MED ORDER — BACITRACIN ZINC 500 UNIT/GM EX OINT
TOPICAL_OINTMENT | CUTANEOUS | Status: DC | PRN
  Administered 2015-08-29: 1 via TOPICAL

## 2015-08-29 MED ORDER — ONDANSETRON HCL 4 MG/2ML IJ SOLN: 4.0000 mg | Freq: Four times a day (QID) | INTRAMUSCULAR | Status: DC | PRN

## 2015-08-29 MED ORDER — MORPHINE SULFATE (PF) 2 MG/ML IV SOLN: 1.0000 mg | INTRAVENOUS | Status: DC | PRN

## 2015-08-29 MED ORDER — GLYCOPYRROLATE 0.2 MG/ML IJ SOLN: 0.2000 mg | Freq: Once | INTRAMUSCULAR | Status: DC | PRN

## 2015-08-29 MED ORDER — ONDANSETRON HCL 4 MG/2ML IJ SOLN: 4.0000 mg | INTRAMUSCULAR | Status: DC | PRN

## 2015-08-29 MED ORDER — PROMETHAZINE HCL 25 MG RE SUPP: 25.0000 mg | Freq: Four times a day (QID) | RECTAL | Status: DC | PRN

## 2015-08-29 MED ORDER — MIDAZOLAM HCL 2 MG/2ML IJ SOLN
INTRAMUSCULAR | Status: AC
  Filled 2015-08-29: qty 4

## 2015-08-29 MED ORDER — PHENOL 1.4 % MT LIQD: 1.0000 | OROMUCOSAL | Status: DC | PRN

## 2015-08-29 MED ORDER — ROCURONIUM BROMIDE 100 MG/10ML IV SOLN
INTRAVENOUS | Status: DC | PRN
  Administered 2015-08-29: 20 mg via INTRAVENOUS

## 2015-08-29 MED ORDER — FENTANYL CITRATE (PF) 250 MCG/5ML IJ SOLN
INTRAMUSCULAR | Status: AC
  Filled 2015-08-29: qty 5

## 2015-08-29 MED ORDER — LIDOCAINE-EPINEPHRINE 1 %-1:100000 IJ SOLN
INTRAMUSCULAR | Status: DC | PRN
  Administered 2015-08-29: 5 mL

## 2015-08-29 MED ORDER — EPHEDRINE SULFATE 50 MG/ML IJ SOLN
INTRAMUSCULAR | Status: DC | PRN
  Administered 2015-08-29: 10 mg via INTRAVENOUS

## 2015-08-29 MED ORDER — FENTANYL CITRATE (PF) 100 MCG/2ML IJ SOLN: 50.0000 ug | INTRAMUSCULAR | Status: DC | PRN

## 2015-08-29 MED ORDER — GLYCOPYRROLATE 0.2 MG/ML IJ SOLN
INTRAMUSCULAR | Status: DC | PRN
  Administered 2015-08-29: .4 mg via INTRAVENOUS
  Administered 2015-08-29: .2 mg via INTRAVENOUS

## 2015-08-29 MED ORDER — CLINDAMYCIN PHOSPHATE 300 MG/50ML IV SOLN
300.0000 mg | Freq: Four times a day (QID) | INTRAVENOUS | Status: AC
  Administered 2015-08-29 – 2015-08-30 (×4): 300 mg via INTRAVENOUS
  Filled 2015-08-29 (×4): qty 50

## 2015-08-29 MED ORDER — ONDANSETRON HCL 4 MG PO TABS: 4.0000 mg | ORAL_TABLET | ORAL | Status: DC | PRN

## 2015-08-29 MED ORDER — PROPOFOL 10 MG/ML IV BOLUS
INTRAVENOUS | Status: DC | PRN
  Administered 2015-08-29: 30 mg via INTRAVENOUS
  Administered 2015-08-29: 40 mg via INTRAVENOUS
  Administered 2015-08-29: 30 mg via INTRAVENOUS
  Administered 2015-08-29: 40 mg via INTRAVENOUS

## 2015-08-29 MED ORDER — ALUM & MAG HYDROXIDE-SIMETH 200-200-20 MG/5ML PO SUSP: 30.0000 mL | Freq: Four times a day (QID) | ORAL | Status: DC | PRN

## 2015-08-29 MED ORDER — SUCCINYLCHOLINE CHLORIDE 20 MG/ML IJ SOLN
INTRAMUSCULAR | Status: DC | PRN
  Administered 2015-08-29: 120 mg via INTRAVENOUS

## 2015-08-29 MED ORDER — HYDROCODONE-ACETAMINOPHEN 5-325 MG PO TABS
1.0000 | ORAL_TABLET | ORAL | Status: DC | PRN
  Administered 2015-08-29: 1 via ORAL
  Filled 2015-08-29: qty 1

## 2015-08-29 MED ORDER — SCOPOLAMINE 1 MG/3DAYS TD PT72: 1.0000 | MEDICATED_PATCH | Freq: Once | TRANSDERMAL | Status: DC | PRN

## 2015-08-29 MED ORDER — DEXMEDETOMIDINE HCL IN NACL 400 MCG/100ML IV SOLN
INTRAVENOUS | Status: DC | PRN
  Administered 2015-08-29: 1 ug/kg/h via INTRAVENOUS

## 2015-08-29 MED ORDER — CLINDAMYCIN PHOSPHATE 900 MG/50ML IV SOLN
INTRAVENOUS | Status: AC
  Filled 2015-08-29: qty 50

## 2015-08-29 MED ORDER — PROPOFOL 10 MG/ML IV BOLUS
INTRAVENOUS | Status: DC | PRN
  Administered 2015-08-29: 150 mg via INTRAVENOUS
  Administered 2015-08-29 (×3): 50 mg via INTRAVENOUS

## 2015-08-29 MED ORDER — ONDANSETRON HCL 4 MG/2ML IJ SOLN
INTRAMUSCULAR | Status: AC
  Filled 2015-08-29: qty 2

## 2015-08-29 MED ORDER — 0.9 % SODIUM CHLORIDE (POUR BTL) OPTIME
TOPICAL | Status: DC | PRN
  Administered 2015-08-29: 1000 mL

## 2015-08-29 MED ORDER — HYDROMORPHONE HCL 1 MG/ML IJ SOLN: 0.2500 mg | INTRAMUSCULAR | Status: DC | PRN

## 2015-08-29 MED ORDER — LIDOCAINE HCL (CARDIAC) 20 MG/ML IV SOLN
INTRAVENOUS | Status: AC
  Filled 2015-08-29: qty 5

## 2015-08-29 SURGICAL SUPPLY — 54 items
ADH SKN CLS APL DERMABOND .7 (GAUZE/BANDAGES/DRESSINGS)
ATTRACTOMAT 16X20 MAGNETIC DRP (DRAPES) IMPLANT
BLADE SCALPEL SHAW  SZ15 (BLADE) IMPLANT
BLADE SURG 15 STRL LF DISP TIS (BLADE) IMPLANT
BLADE SURG 15 STRL SS (BLADE) ×2
BLADE SURG ROTATE 9660 (MISCELLANEOUS) IMPLANT
BNDG GAUZE ELAST 4 BULKY (GAUZE/BANDAGES/DRESSINGS) IMPLANT
CANISTER SUCTION 2500CC (MISCELLANEOUS) ×2 IMPLANT
CLEANER TIP ELECTROSURG 2X2 (MISCELLANEOUS) ×2 IMPLANT
CONT SPEC 4OZ CLIKSEAL STRL BL (MISCELLANEOUS) ×2 IMPLANT
CORDS BIPOLAR (ELECTRODE) ×2 IMPLANT
COVER SURGICAL LIGHT HANDLE (MISCELLANEOUS) ×3 IMPLANT
CRADLE DONUT ADULT HEAD (MISCELLANEOUS) IMPLANT
DERMABOND ADVANCED (GAUZE/BANDAGES/DRESSINGS)
DERMABOND ADVANCED .7 DNX12 (GAUZE/BANDAGES/DRESSINGS) IMPLANT
DRAIN CHANNEL 15F RND FF W/TCR (WOUND CARE) ×1 IMPLANT
DRAIN SNY 10 ROU (WOUND CARE) ×2 IMPLANT
DRAPE PROXIMA HALF (DRAPES) IMPLANT
ELECT COATED BLADE 2.86 ST (ELECTRODE) ×3 IMPLANT
ELECT REM PT RETURN 9FT ADLT (ELECTROSURGICAL) ×2
ELECTRODE REM PT RTRN 9FT ADLT (ELECTROSURGICAL) ×1 IMPLANT
EVACUATOR SILICONE 100CC (DRAIN) ×1 IMPLANT
GAUZE SPONGE 4X4 16PLY XRAY LF (GAUZE/BANDAGES/DRESSINGS) ×5 IMPLANT
GLOVE ECLIPSE 8.0 STRL XLNG CF (GLOVE) ×3 IMPLANT
GLOVE SURG SS PI 6.5 STRL IVOR (GLOVE) ×1 IMPLANT
GLOVE SURG SS PI 7.5 STRL IVOR (GLOVE) ×1 IMPLANT
GOWN STRL REUS W/ TWL LRG LVL3 (GOWN DISPOSABLE) ×1 IMPLANT
GOWN STRL REUS W/ TWL XL LVL3 (GOWN DISPOSABLE) ×1 IMPLANT
GOWN STRL REUS W/TWL LRG LVL3 (GOWN DISPOSABLE) ×2
GOWN STRL REUS W/TWL XL LVL3 (GOWN DISPOSABLE) ×2
KIT BASIN OR (CUSTOM PROCEDURE TRAY) ×2 IMPLANT
KIT ROOM TURNOVER OR (KITS) ×2 IMPLANT
NDL HYPO 25GX1X1/2 BEV (NEEDLE) ×1 IMPLANT
NEEDLE HYPO 25GX1X1/2 BEV (NEEDLE) ×4 IMPLANT
NS IRRIG 1000ML POUR BTL (IV SOLUTION) ×2 IMPLANT
PAD ARMBOARD 7.5X6 YLW CONV (MISCELLANEOUS) ×4 IMPLANT
PENCIL BUTTON HOLSTER BLD 10FT (ELECTRODE) ×2 IMPLANT
SPONGE INTESTINAL PEANUT (DISPOSABLE) IMPLANT
STAPLER VISISTAT 35W (STAPLE) ×2 IMPLANT
SUT CHROMIC 4 0 PS 2 18 (SUTURE) ×6 IMPLANT
SUT ETHILON 3 0 PS 1 (SUTURE) ×4 IMPLANT
SUT ETHILON 5 0 PS 2 18 (SUTURE) ×1 IMPLANT
SUT SILK 0 TIES 10X30 (SUTURE) IMPLANT
SUT SILK 2 0 (SUTURE) ×2
SUT SILK 2 0 SH CR/8 (SUTURE) ×2 IMPLANT
SUT SILK 2-0 18XBRD TIE 12 (SUTURE) IMPLANT
SUT SILK 3 0 REEL (SUTURE) ×2 IMPLANT
SUT SILK 4 0 REEL (SUTURE) IMPLANT
SYR CONTROL 10ML LL (SYRINGE) ×1 IMPLANT
TOWEL OR 17X24 6PK STRL BLUE (TOWEL DISPOSABLE) ×2 IMPLANT
TRAY ENT MC OR (CUSTOM PROCEDURE TRAY) ×2 IMPLANT
TRAY FOLEY CATH 14FRSI W/METER (CATHETERS) IMPLANT
TUBE CONNECTING 12X1/4 (SUCTIONS) ×1 IMPLANT
WATER STERILE IRR 1000ML POUR (IV SOLUTION) IMPLANT

## 2015-08-29 SURGICAL SUPPLY — 85 items
ADH SKN CLS APL DERMABOND .7 (GAUZE/BANDAGES/DRESSINGS) ×1
APL SKNCLS STERI-STRIP NONHPOA (GAUZE/BANDAGES/DRESSINGS)
APPLICATOR COTTON TIP 6IN STRL (MISCELLANEOUS) ×2 IMPLANT
ATTRACTOMAT 16X20 MAGNETIC DRP (DRAPES) ×2 IMPLANT
BENZOIN TINCTURE PRP APPL 2/3 (GAUZE/BANDAGES/DRESSINGS) IMPLANT
BLADE SCALPEL THERMAL 15 (MISCELLANEOUS) ×3 IMPLANT
BLADE SURG 12 STRL SS (BLADE) IMPLANT
BLADE SURG 15 STRL LF DISP TIS (BLADE) ×1 IMPLANT
BLADE SURG 15 STRL SS (BLADE) ×2
BNDG CONFORM 3 STRL LF (GAUZE/BANDAGES/DRESSINGS) IMPLANT
BNDG GAUZE ELAST 4 BULKY (GAUZE/BANDAGES/DRESSINGS) IMPLANT
CANISTER SUCT 1200ML W/VALVE (MISCELLANEOUS) ×2 IMPLANT
CLEANER CAUTERY TIP 5X5 PAD (MISCELLANEOUS) ×1 IMPLANT
CORDS BIPOLAR (ELECTRODE) ×2 IMPLANT
COVER BACK TABLE 60X90IN (DRAPES) ×2 IMPLANT
COVER MAYO STAND STRL (DRAPES) ×2 IMPLANT
DECANTER SPIKE VIAL GLASS SM (MISCELLANEOUS) ×2 IMPLANT
DERMABOND ADVANCED (GAUZE/BANDAGES/DRESSINGS) ×1
DERMABOND ADVANCED .7 DNX12 (GAUZE/BANDAGES/DRESSINGS) IMPLANT
DRAIN CHANNEL 7F FF FLAT (WOUND CARE) IMPLANT
DRAIN JACKSON RD 7FR 3/32 (WOUND CARE) ×1 IMPLANT
DRAIN JP 10F RND SILICONE (MISCELLANEOUS) IMPLANT
DRAIN PENROSE 1/4X12 LTX STRL (WOUND CARE) IMPLANT
DRAIN SNY WOU 7FLT (WOUND CARE) IMPLANT
DRAIN TLS ROUND 10FR (DRAIN) IMPLANT
DRAPE INCISE 23X17 IOBAN STRL (DRAPES) ×1
DRAPE INCISE 23X17 STRL (DRAPES) ×1 IMPLANT
DRAPE INCISE IOBAN 23X17 STRL (DRAPES) ×1 IMPLANT
DRAPE U-SHAPE 76X120 STRL (DRAPES) ×2 IMPLANT
ELECT COATED BLADE 2.86 ST (ELECTRODE) ×2 IMPLANT
ELECT PAIRED SUBDERMAL (MISCELLANEOUS) ×2
ELECT REM PT RETURN 9FT ADLT (ELECTROSURGICAL) ×2
ELECTRODE PAIRED SUBDERMAL (MISCELLANEOUS) IMPLANT
ELECTRODE REM PT RTRN 9FT ADLT (ELECTROSURGICAL) ×1 IMPLANT
EVACUATOR 3/16  PVC DRAIN (DRAIN)
EVACUATOR 3/16 PVC DRAIN (DRAIN) IMPLANT
EVACUATOR SILICONE 100CC (DRAIN) IMPLANT
GAUZE SPONGE 4X4 16PLY XRAY LF (GAUZE/BANDAGES/DRESSINGS) ×1 IMPLANT
GLOVE BIO SURGEON STRL SZ 6.5 (GLOVE) ×2 IMPLANT
GLOVE BIO SURGEON STRL SZ7.5 (GLOVE) IMPLANT
GLOVE BIOGEL PI IND STRL 8 (GLOVE) IMPLANT
GLOVE BIOGEL PI INDICATOR 8 (GLOVE)
GLOVE ECLIPSE 7.5 STRL STRAW (GLOVE) IMPLANT
GLOVE ECLIPSE 8.0 STRL XLNG CF (GLOVE) ×2 IMPLANT
GLOVE SS BIOGEL STRL SZ 7.5 (GLOVE) IMPLANT
GLOVE SUPERSENSE BIOGEL SZ 7.5 (GLOVE)
GLOVE SURG SS PI 7.0 STRL IVOR (GLOVE) ×1 IMPLANT
GOWN STRL REUS W/ TWL LRG LVL3 (GOWN DISPOSABLE) ×1 IMPLANT
GOWN STRL REUS W/ TWL XL LVL3 (GOWN DISPOSABLE) ×1 IMPLANT
GOWN STRL REUS W/TWL LRG LVL3 (GOWN DISPOSABLE) ×2
GOWN STRL REUS W/TWL XL LVL3 (GOWN DISPOSABLE) ×2
LIQUID BAND (GAUZE/BANDAGES/DRESSINGS) IMPLANT
LOCATOR NERVE 3 VOLT (DISPOSABLE) IMPLANT
NDL HYPO 25X1 1.5 SAFETY (NEEDLE) ×1 IMPLANT
NEEDLE HYPO 25X1 1.5 SAFETY (NEEDLE) ×2 IMPLANT
NS IRRIG 1000ML POUR BTL (IV SOLUTION) ×2 IMPLANT
PACK BASIN DAY SURGERY FS (CUSTOM PROCEDURE TRAY) ×2 IMPLANT
PAD CLEANER CAUTERY TIP 5X5 (MISCELLANEOUS) ×1
PENCIL BUTTON HOLSTER BLD 10FT (ELECTRODE) ×2 IMPLANT
PIN SAFETY STERILE (MISCELLANEOUS) IMPLANT
PROBE NERVBE PRASS .33 (MISCELLANEOUS) IMPLANT
SHEARS HARMONIC 9CM CVD (BLADE) IMPLANT
SHEET MEDIUM DRAPE 40X70 STRL (DRAPES) IMPLANT
SLEEVE SCD COMPRESS KNEE MED (MISCELLANEOUS) ×2 IMPLANT
SPONGE GAUZE 4X4 12PLY STER LF (GAUZE/BANDAGES/DRESSINGS) IMPLANT
STAPLER VISISTAT 35W (STAPLE) ×2 IMPLANT
STRIP CLOSURE SKIN 1/4X4 (GAUZE/BANDAGES/DRESSINGS) IMPLANT
SUCTION FRAZIER TIP 10 FR DISP (SUCTIONS) IMPLANT
SUT CHROMIC 4 0 PS 2 18 (SUTURE) ×4 IMPLANT
SUT ETHILON 3 0 PS 1 (SUTURE) ×2 IMPLANT
SUT ETHILON 5 0 P 3 18 (SUTURE)
SUT NYLON ETHILON 5-0 P-3 1X18 (SUTURE) IMPLANT
SUT SILK 2 0 FS (SUTURE) ×4 IMPLANT
SUT SILK 2 0 TIES 17X18 (SUTURE)
SUT SILK 2-0 18XBRD TIE BLK (SUTURE) IMPLANT
SUT SILK 3 0 SH CR/8 (SUTURE) ×2 IMPLANT
SUT SILK 3 0 TIES 17X18 (SUTURE) ×2
SUT SILK 3-0 18XBRD TIE BLK (SUTURE) ×1 IMPLANT
SUT SILK 4 0 TIES 17X18 (SUTURE) IMPLANT
SYR BULB 3OZ (MISCELLANEOUS) ×2 IMPLANT
SYR CONTROL 10ML LL (SYRINGE) ×2 IMPLANT
TOWEL OR 17X24 6PK STRL BLUE (TOWEL DISPOSABLE) ×4 IMPLANT
TRAY DSU PREP LF (CUSTOM PROCEDURE TRAY) ×2 IMPLANT
TUBE CONNECTING 20X1/4 (TUBING) ×2 IMPLANT
YANKAUER SUCT BULB TIP NO VENT (SUCTIONS) IMPLANT

## 2015-08-29 NOTE — Addendum Note (Signed)
Addendum  created 08/29/15 1514 by Lorrene Reid, MD   Modules edited: Clinical Notes   Clinical Notes:  File: [id number]; [place]: [id number]

## 2015-08-29 NOTE — Progress Notes (Signed)
Patient came in with severe swelling to face and jaw. Anesthesia made aware upon patient's arrival. Full preop information not obtain due to this being an emergent situation.

## 2015-08-29 NOTE — Op Note (Signed)
DATE: [place]    Patient Name:  Chrysanthemum, Leckie  [id number]   Pre-Op Dx: Left parotid neoplasm  Post-op Dx: Same  Proc: Left superficial parotidectomy with facial nerve preservation   Surg:  [name] T MD  Asst:  Sallee Provencal, PA  Anes:  GOT  EBL: Minimal  Comp:  None  Findings:  A firm lobulated 2 cm tumor in the high anterior isthmus of the gland.  Procedure: With the patient in a comfortable supine position, general orotracheal anesthesia was induced without difficulty. The head was rotated to the right for access to the left neck. The neck was palpated with the findings as described above. Orienting initials were identified. A preoperative surgical timeout was performed in the standard fashion. The proposed incision was marked and then infiltrated with 1% Xylocaine with 1 100,000 epinephrine, 5 mL total. Several minutes were allowed for this to take effect. The NIMS system was used. A chlorhexidine sterile preparation and draping of the left neck was accomplished in the standard fashion.   the skin incision was sharply executed. The lobule was dissected backward. The periparotid flap was elevated. Greater auricular nerve was identified and protected. The parotid tail was dissected from the cartilaginous canal and the sternomastoid muscle and rolled forward. 3-0 silk tie back sutures were used.  Working on a broad front along the sternomastoid muscle and the cartilaginous canal, the Shaw scalpel was used to dissected downward. Small vessels were controlled with the scalpel or with silk ligature. Upon approaching the styloid mastoid foramen, blunt dissection was accomplished in the facial nerve was identified.  Dissection was carried peripherally along the facial nerve starting with the most inferior branches and then working upward, lysing the gland from the nerve using the Shaw scalpel. Upon clearing the inferior branch, the superior branch was dissected from  superiorly downward. The tumor mass of concern was noted in the anterior superior isthmus. With the nerve under direct vision, all branches were dissected away from the isthmus which was then lysed beginning at the masseter muscle and working posteriorly. The tumor was contained within the specimen which was delivered for permanent pathologic interpretation. Small areas of bleeding were controlled with bipolar cautery. Several identified veins were controlled with silk ligature throughout the case.  At this point the wound was thoroughly irrigated. A Valsalva did not reveal any significant bleeding.  A 10 French perforated round drain was brought out through the neck at a separate puncture site and secured with a 3-0 nylon stitch.  The drain was laid into the wound. Wound was closed with interrupted 4-0 chromic in 2 layers and finally Dermabond on the skin. Suction drain was intact and functional.   Dispo:   PACU to 23 hour observation or possibly discharge home for drain management per family.  Plan:  Ice, elevation, analgesia, 23 hour suction drainage.  Tyson Alias MD

## 2015-08-29 NOTE — Anesthesia Preprocedure Evaluation (Addendum)
Anesthesia Evaluation  Patient identified by MRN, date of birth, ID band Patient awake    Reviewed: Allergy & Precautions, NPO status , Patient's Chart, lab work & pertinent test results  Airway      Mouth opening: Limited Mouth Opening  Dental   Pulmonary Current Smoker,    breath sounds clear to auscultation       Cardiovascular negative cardio ROS   Rhythm:Regular Rate:Normal     Neuro/Psych negative neurological ROS  negative psych ROS   GI/Hepatic negative GI ROS, Neg liver ROS,   Endo/Other  negative endocrine ROS  Renal/GU negative Renal ROS  negative genitourinary   Musculoskeletal negative musculoskeletal ROS (+)   Abdominal   Peds negative pediatric ROS (+)  Hematology negative hematology ROS (+)   Anesthesia Other Findings   Reproductive/Obstetrics negative OB ROS                            No results found for: WBC, HGB, HCT, MCV, PLT No results found for: CREATININE, BUN, NA, K, CL, CO2 No results found for: INR, PROTIME   Anesthesia Physical Anesthesia Plan  ASA: II and emergent  Anesthesia Plan: General   Post-op Pain Management:    Induction: Intravenous  Airway Management Planned: Oral ETT, Awake Intubation Planned and Fiberoptic Intubation Planned  Additional Equipment:   Intra-op Plan:   Post-operative Plan: Extubation in OR  Informed Consent: I have reviewed the patients History and Physical, chart, labs and discussed the procedure including the risks, benefits and alternatives for the proposed anesthesia with the patient or authorized representative who has indicated his/her understanding and acceptance.   Dental advisory given  Plan Discussed with: CRNA  Anesthesia Plan Comments:        Anesthesia Quick Evaluation

## 2015-08-29 NOTE — Interval H&P Note (Signed)
History and Physical Interval Note:  08/29/2015 4:10 PM  [name] [name]  Developed expansile hematoma in PACU.  Could not stop bleeding in my office.  Will take to OR to explore wound and control bleeding.  Discussed with patient including risks and complications.  [name]

## 2015-08-29 NOTE — Transfer of Care (Signed)
Immediate Anesthesia Transfer of Care Note  Patient: [name] [name]  Procedure(s) Performed: Procedure(s): Left neck Wound exploration bleeding, Evacuation of Hematoma (Left)  Patient Location: PACU  Anesthesia Type:General  Level of Consciousness: awake, patient cooperative and responds to stimulation  Airway & Oxygen Therapy: Patient Spontanous Breathing and Patient connected to face mask oxygen  Post-op Assessment: Report given to RN, Post -op Vital signs reviewed and stable and Patient moving all extremities X 4  Post vital signs: Reviewed and stable  Last Vitals:  Filed Vitals:   08/29/15 1815  BP:   Pulse:   Temp: 36.1 C  Resp:     Complications: No apparent anesthesia complications

## 2015-08-29 NOTE — Progress Notes (Signed)
Called by the [facility] nurses regarding an acute Left cheek swelling and bleeding around the drain site.  The JP drain seems to have stopped draining. VSS and pt could open her mouth on 2 cm.  I spoke with Dr. Erik Obey and described the situation. He felt the most reasonable course would be to send her to his office so he could drain the hematoma.  I instructed the nurses to send her immediately.

## 2015-08-29 NOTE — Anesthesia Preprocedure Evaluation (Signed)
Anesthesia Evaluation  Patient identified by MRN, date of birth, ID band Patient awake    Reviewed: Allergy & Precautions, NPO status , Patient's Chart, lab work & pertinent test results  Airway Mallampati: II  TM Distance: >3 FB Neck ROM: Full    Dental  (+) Dental Advisory Given   Pulmonary Current Smoker,    breath sounds clear to auscultation       Cardiovascular negative cardio ROS   Rhythm:Regular Rate:Normal     Neuro/Psych negative neurological ROS     GI/Hepatic negative GI ROS, Neg liver ROS,   Endo/Other  negative endocrine ROS  Renal/GU negative Renal ROS     Musculoskeletal   Abdominal   Peds  Hematology negative hematology ROS (+)   Anesthesia Other Findings   Reproductive/Obstetrics                             Anesthesia Physical Anesthesia Plan  ASA: II  Anesthesia Plan: General   Post-op Pain Management:    Induction: Intravenous  Airway Management Planned: Oral ETT  Additional Equipment:   Intra-op Plan:   Post-operative Plan: Extubation in OR  Informed Consent: I have reviewed the patients History and Physical, chart, labs and discussed the procedure including the risks, benefits and alternatives for the proposed anesthesia with the patient or authorized representative who has indicated his/her understanding and acceptance.   Dental advisory given  Plan Discussed with: CRNA  Anesthesia Plan Comments:         Anesthesia Quick Evaluation

## 2015-08-29 NOTE — Op Note (Signed)
08/29/2015  6:06 PM    Hennie Duos  IN:[id number]   Pre-Op Dx:  Parotidectomy wound hematoma  Post-op Dx: Same  Proc: Left neck wound exploration and control of bleeding with evacuation of hematoma   Surg:  [name] T MD  Asst:  GORE, MD  Anes:  GOT  EBL:  100 ml  Comp:  none  Findings:  Large hematoma in the left parotidectomy wound. Several small oozing vessels.  Procedure: With the patient in a comfortable supine position, rapid induction general orotracheal anesthesia was performed in the standard fashion. Patient was placed in reverse Trendelenburg. The head was rotated to the right for access to left neck. Findings were as described above. A sterile preparation and draping of the left neck was accomplished in the standard fashion.  The inferior aspect of the wound was reopened. A large amount of hematoma was evacuated digitally. No major bleeding sites identified. The facial nerve was identified and protected at all times.  Several small vessels were controlled with silk ligature and with bipolar cautery. Hemostasis was observed. The wound was thoroughly irrigated. Valsalva maneuver did not show any additional bleeding sites.  A 15 French fluted drain was placed through the previous drain site. This was secured with a 3-0 nylon stitch.  The wound was closed with interrupted 4-0 chromic and then a 5-0 running simple nylon on the skin surface in a cosmetic fashion.  Hemostasis was observed. The drain was functional.  Dispo:   PACU to 23 hour observation  Plan:  Ice, elevation, suction drainage. If she is doing nicely we'll remove the drain in 24 hours and discharge her to home in care of family.  Tyson Alias MD

## 2015-08-29 NOTE — H&P (View-Only) (Signed)
[name],  [name] 53 y.o., female [id number]     Chief Complaint: LEFT parotid mass  HPI: 51-year-old white female has noticed a lump in front of her LEFT ear now for perhaps 2 years. It seems to be growing slowly. It is tender when she palpates it but not otherwise.  No trismus. It does not swell with meals. No prior trauma to the area. She thought she might have a "ganglion cyst". No ear pain or drainage. No other neck masses. No masses in axillae or groins.  No prior evaluation or any imaging studies.  Spoke to patient told her per Dr Rosen that her FNA showed benign salivary gland.  Told her that Dr Evonne Rinks will review this also when he comes in.  Patient understood.    2-1/2 years return visit for this 53-year-old white female.  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  For personal reasons, she was unable to come back in for parotidectomy.  She is back now for further consideration.  The mass may have grown slightly by her estimation.  It does not hurt.  It does not swell with meals.  No other salivary gland masses, or neck masses.  No difficulty breathing, eating, or swallowing.  She does continue to smoke.  Preoperative visit.  We are preparing to do a LEFT superficial parotidectomy for presumed benign neoplasm next week.  I discussed this with her in detail including risks and complications.  Questions were answered and informed consent obtained.  I discussed advancement of diet and activity including return to work and activities.  I gave her a prescription for Vicodin 5 mg for postoperative pain.  I will see her back here 10 days after surgery.  PMH: Past Medical History  Diagnosis Date  . Parotid neoplasm     left    Surg Hx: Past Surgical History  Procedure Laterality Date  . Abdominal hysterectomy      partial    FHx:  No family history on file. SocHx:  reports that she quit smoking yesterday. Her smoking use included Cigarettes. She  has a 17.5 pack-year smoking history. She does not have any smokeless tobacco history on file. She reports that she drinks alcohol. She reports that she does not use illicit drugs.  ALLERGIES:  Allergies  Allergen Reactions  . Penicillins Hives     (Not in a hospital admission)  No results found for this or any previous visit (from the past 48 hour(s)). No results found.  ROS:Systemic: Not feeling tired (fatigue).  No fever, no night sweats, and no recent weight loss. Head: No headache. Eyes: No eye symptoms. Otolaryngeal: No hearing loss, no earache, no tinnitus, and no purulent nasal discharge.  No nasal passage blockage (stuffiness), no snoring, no sneezing, no hoarseness, and no sore throat. Cardiovascular: No chest pain or discomfort  and no palpitations. Pulmonary: No dyspnea, no cough, and no wheezing. Gastrointestinal: No dysphagia  and no heartburn.  No nausea, no abdominal pain, and no melena.  No diarrhea. Genitourinary: No dysuria. Endocrine: No muscle weakness. Musculoskeletal: No calf muscle cramps, no arthralgias, and no soft tissue swelling. Neurological: No dizziness, no fainting, no tingling, and no numbness. Psychological: No anxiety  and no depression. Skin: No rash.  BP:146/87,  HR: 70 b/min,  Height: 62 in, Weight: 133.5 lb, BMI: 24.4 kg/m2   PHYSICAL EXAM: She is trim and elevated.  Mental status is appropriate.  She hears well in conversational speech.  Voice   is clear and respirations unlabored through the nose.  The head is atraumatic and neck supple.  Cranial nerves intact, including facial nerve.  Ear canals are clear with normal drums.  Anterior nose is moist and patent.  Oral cavity is moist with teeth in good repair.  Oropharynx clear.  Neck is remarkable for a 1.5 cm firm and mobile mass in the LEFT mid parotid.  No other neck masses.  No adenopathy.  Lungs: Clear to auscultation Heart: Regular rate and rhythm Abdomen: Soft, active Extremities:  Normal configuration Neurologic: Symmetric, grossly intact.  Studies Reviewed:  FNA LEFT parotid lesion    Assessment/Plan Cigarette nicotine dependence without complication (305.1) (F17.210). 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  We're going to remove your LEFT parotid tumor.  You will stay overnight one night.  I would like you to buy a small bottle of Hibiclens (chlorhexidine) surgical scrub solution and wash your face and neck with this the night before surgery and again the morning of surgery.  No strenuous activity for 2 weeks after surgery.  You are okay to go back to work one week after surgery.  I will see you back here for a postop check 10 days after surgery.  Hydrocodone-Acetaminophen 5-325 MG Oral Tablet;1-2 po q4-6h prn pain; Qty30; R0; Rx.  Meryn Sarracino 08/24/2015, 12:37 PM     

## 2015-08-29 NOTE — Discharge Instructions (Signed)
Ice pack x 24 hs Keep head elevated 3-4 nights Return visit my office tomorrow for drain removal if you go home.   [phone] for an appointment No strenuous activity x 2 weeks OK to shower beginning Tuesday after drain is removed. Diet as comfortable Routine recheck 10 days please. Call for swelling, signs of bleeding, signs of infection I will call you with the pathology report later this week.   Post Anesthesia Home Care Instructions  Activity: Get plenty of rest for the remainder of the day. A responsible adult should stay with you for 24 hours following the procedure.  For the next 24 hours, DO NOT: -Drive a car -Paediatric nurse -Drink alcoholic beverages -Take any medication unless instructed by your physician -Make any legal decisions or sign important papers.  Meals: Start with liquid foods such as gelatin or soup. Progress to regular foods as tolerated. Avoid greasy, spicy, heavy foods. If nausea and/or vomiting occur, drink only clear liquids until the nausea and/or vomiting subsides. Call your physician if vomiting continues.  Special Instructions/Symptoms: Your throat may feel dry or sore from the anesthesia or the breathing tube placed in your throat during surgery. If this causes discomfort, gargle with warm salt water. The discomfort should disappear within 24 hours.  If you had a scopolamine patch placed behind your ear for the management of post- operative nausea and/or vomiting:  1. The medication in the patch is effective for 72 hours, after which it should be removed.  Wrap patch in a tissue and discard in the trash. Wash hands thoroughly with soap and water. 2. You may remove the patch earlier than 72 hours if you experience unpleasant side effects which may include dry mouth, dizziness or visual disturbances. 3. Avoid touching the patch. Wash your hands with soap and water after contact with the patch.   About my Jackson-Pratt Bulb Drain  What is a  Jackson-Pratt bulb? A Jackson-Pratt is a soft, round device used to collect drainage. It is connected to a long, thin drainage catheter, which is held in place by one or two small stiches near your surgical incision site. When the bulb is squeezed, it forms a vacuum, forcing the drainage to empty into the bulb.  Emptying the Jackson-Pratt bulb- To empty the bulb: 1. Release the plug on the top of the bulb. 2. Pour the bulb's contents into a measuring container which your nurse will provide. 3. Record the time emptied and amount of drainage. Empty the drain(s) as often as your     doctor or nurse recommends.  Date                  Time                    Amount (Drain 1)                 Amount (Drain 2)  _____________________________________________________________________  _____________________________________________________________________  _____________________________________________________________________  _____________________________________________________________________  _____________________________________________________________________  _____________________________________________________________________  _____________________________________________________________________  _____________________________________________________________________  Squeezing the Jackson-Pratt Bulb- To squeeze the bulb: 1. Make sure the plug at the top of the bulb is open. 2. Squeeze the bulb tightly in your fist. You will hear air squeezing from the bulb. 3. Replace the plug while the bulb is squeezed. 4. Use a safety pin to attach the bulb to your clothing. This will keep the catheter from     pulling at the bulb insertion site.  When to call your doctor- Call your doctor  if:  Drain site becomes red, swollen or hot.  You have a fever greater than 101 degrees F.  There is oozing at the drain site.  Drain falls out (apply a guaze bandage over the drain hole and secure it with  tape).  Drainage increases daily not related to activity patterns. (You will usually have more drainage when you are active than when you are resting.)  Drainage has a bad odor.

## 2015-08-29 NOTE — Progress Notes (Signed)
Patient ready to go. Discharge instructions gone over with patient and daughter in law Sutton. Patient wanted cup of coffee and new ice bag. Upon returning to room in less than five minutes, patients face had very large hematoma the size of a golf ball on the left side of face. Patient able to stick out tongue, raise eyebrows, and smile. Patient alert and oriented and VSS stable. Patient very anxious. Dr. Warren Danes office called to inform of the situation. Plans are to send patient to office immediately.

## 2015-08-29 NOTE — Anesthesia Procedure Notes (Signed)
Procedure Name: Intubation Date/Time: 08/29/2015 9:20 AM Performed by: Marrianne Mood Pre-anesthesia Checklist: Patient identified, Emergency Drugs available, Suction available, Patient being monitored and Timeout performed Patient Re-evaluated:Patient Re-evaluated prior to inductionOxygen Delivery Method: Circle System Utilized Preoxygenation: Pre-oxygenation with 100% oxygen Intubation Type: IV induction Ventilation: Mask ventilation without difficulty Laryngoscope Size: Miller and 3 Grade View: Grade II Tube type: Oral Tube size: 7.0 mm Number of attempts: 1 Airway Equipment and Method: Stylet and Oral airway Placement Confirmation: ETT inserted through vocal cords under direct vision,  positive ETCO2 and breath sounds checked- equal and bilateral Secured at: 21 cm Tube secured with: Tape Dental Injury: Teeth and Oropharynx as per pre-operative assessment

## 2015-08-29 NOTE — Anesthesia Postprocedure Evaluation (Signed)
  Anesthesia Post-op Note  Patient: [name] [name]  Procedure(s) Performed: Procedure(s): LEFT SUPERFICIAL PAROTIDECTOMY (Left)  Patient Location: PACU  Anesthesia Type: General   Level of Consciousness: awake, alert  and oriented  Airway and Oxygen Therapy: Patient Spontanous Breathing  Post-op Pain: mild  Post-op Assessment: Post-op Vital signs reviewed  Post-op Vital Signs: Reviewed  Last Vitals:  Filed Vitals:   08/29/15 1200  BP: 145/93  Pulse: 88  Temp:   Resp: 20    Complications: No apparent anesthesia complications

## 2015-08-29 NOTE — Progress Notes (Signed)
S/p left superficial parotidectomy and takeback for washout/control of left neck hematoma.   Drain holding bulb suction, neck/face flat. Facial nerve House-Brackmann 1/6 bilaterally in all divisions, incision intact  A/P: monitor drain output, pain control, wound care  Ruby Cola, MD  8:16 PM  08/29/2015

## 2015-08-29 NOTE — Transfer of Care (Signed)
Immediate Anesthesia Transfer of Care Note  Patient: [name] [name]  Procedure(s) Performed: Procedure(s): LEFT SUPERFICIAL PAROTIDECTOMY (Left)  Patient Location: PACU  Anesthesia Type:General  Level of Consciousness: awake and patient cooperative  Airway & Oxygen Therapy: Patient Spontanous Breathing and Patient connected to face mask oxygen  Post-op Assessment: Report given to RN and Post -op Vital signs reviewed and stable  Post vital signs: Reviewed and stable  Last Vitals:  Filed Vitals:   08/29/15 1127  BP:   Pulse: 105  Temp:   Resp: 11    Complications: No apparent anesthesia complications

## 2015-08-29 NOTE — Discharge Instructions (Signed)
Clean the wound with Q tip and water twice daily, then apply a thin coat of Bacitracin ointment or similar. Ice pack x 24 hrs Sleep with head elevated for 3-4 nights. OK to shower starting Wed AM. Recheck my office 10 days,  [phone] for an appointment. I will call with the pathology report later this week. Call for signs of bleeding or infection. No strenuous activity x 2 weeks.

## 2015-08-29 NOTE — Interval H&P Note (Signed)
History and Physical Interval Note:  08/29/2015 8:34 AM  [name] [name]  has presented today for surgery, with the diagnosis of left parotid neoplasm  The various methods of treatment have been discussed with the patient and family. After consideration of risks, benefits and other options for treatment, the patient has consented to  Procedure(s): LEFT SUPERFICIAL PAROTIDECTOMY (Left) as a surgical intervention .  The patient's history has been re-reviewed, patient re-examined, no change in status, stable for surgery.  I have re-reviewed the patient's chart and labs.  Questions were answered to the patient's satisfaction.     [name]

## 2015-08-29 NOTE — Anesthesia Procedure Notes (Signed)
Procedure Name: Intubation Date/Time: 08/29/2015 5:06 PM Performed by: Tressia Miners LEFFEW Pre-anesthesia Checklist: Patient identified, Timeout performed, Emergency Drugs available, Suction available and Patient being monitored Patient Re-evaluated:Patient Re-evaluated prior to inductionOxygen Delivery Method: Circle system utilized Preoxygenation: Pre-oxygenation with 100% oxygen Intubation Type: IV induction, Rapid sequence and Cricoid Pressure applied Laryngoscope Size: Glidescope and 3 Grade View: Grade I Tube size: 7.0 mm Number of attempts: 1 Airway Equipment and Method: Stylet and Video-laryngoscopy Secured at: 22 cm Tube secured with: Tape Dental Injury: Teeth and Oropharynx as per pre-operative assessment  Difficulty Due To: Difficulty was anticipated, Difficult Airway- due to limited oral opening and Difficult Airway-  due to edematous airway

## 2015-08-30 ENCOUNTER — Encounter (HOSPITAL_COMMUNITY): Payer: Self-pay

## 2015-08-30 DIAGNOSIS — D110 Benign neoplasm of parotid gland: Secondary | ICD-10-CM | POA: Diagnosis not present

## 2015-08-30 NOTE — Discharge Summary (Signed)
  08/30/2015 1:06 PM  Hennie Duos [id number]  Post-Op Day 1    Temp:  [97 F (36.1 C)-98.6 F (37 C)] 98.4 F (36.9 C) (11/15 1009) Pulse Rate:  [61-73] 70 (11/15 1009) Resp:  [15-22] 19 (11/15 1009) BP: (98-173)/(61-81) 138/74 mmHg (11/15 1009) SpO2:  [97 %-99 %] 99 % (11/15 1009) Weight:  [63.5 kg (139 lb 15.9 oz)] 63.5 kg (139 lb 15.9 oz) (11/15 0800),     Intake/Output Summary (Last 24 hours) at 08/30/15 1306 Last data filed at 08/30/15 0921  Gross per 24 hour  Intake 2814.58 ml  Output     90 ml  Net 2724.58 ml   Drain:  70 ml No results found for this or any previous visit (from the past 24 hour(s)).  SUBJECTIVE:  Mod pain.  Controlled.  Ambulatory. Voiding. Taking po's.   OBJECTIVE:  Face mod swollen.  No fluctuance.  Facial n intact all branches.  Drain removed.  IMPRESSION:  Satisfactory check  PLAN:  Discharge home.  Admit:   46 NOV Discharge: 80 NOV Final Diagnosis:  LEFT parotid neoplasm.  Post op wound hematoma Proc:  LEFT superficial parotidectomy, 2 NOV.  Wound exploration with evacuation hematoma, 14 NOV Cond:  Drain out.  Eating and drinking.  Voiding.  Pain controlled. Rx's:   Hydrocodone Comp:  None Instructions written and given  Hosp Course:  Pt underwent LEFT superficial parotidectomy .  Had post op bleeding with hematoma.  Wound exploration under anesthesia .  Pt observed overnight with no further bleeding.  Drain removed POD 1.  Discharged to home and care of family.  [name]

## 2015-08-30 NOTE — Progress Notes (Signed)
[name] [name] to be D/C'd home per MD order. Discussed with the patient and all questions fully answered.  VSS, Surgical site clean, dry, intact with sutures in place.  Drain removed by Dr. Erik Obey.  IV catheter discontinued intact. Site without signs and symptoms of complications. Dressing and pressure applied.  An After Visit Summary was printed and given to the patient. Patient received prescription.  D/c education completed with patient/family including follow up instructions, medication list, d/c activities limitations if indicated, with other d/c instructions as indicated by MD - patient able to verbalize understanding, all questions fully answered.   Patient instructed to return to ED, call 911, or call MD for any changes in condition.   Patient to be escorted via [place], and D/C home via private auto.

## 2015-08-31 NOTE — Anesthesia Postprocedure Evaluation (Signed)
  Anesthesia Post-op Note  Patient: [name] [name]  Procedure(s) Performed: Procedure(s): Left neck Wound exploration bleeding, Evacuation of Hematoma (Left)  Patient Location: PACU  Anesthesia Type:General  Level of Consciousness: awake, alert  and oriented  Airway and Oxygen Therapy: Patient Spontanous Breathing  Post-op Pain: mild  Post-op Assessment: Post-op Vital signs reviewed and Patient's Cardiovascular Status Stable              Post-op Vital Signs: Reviewed and stable  Last Vitals:  Filed Vitals:   08/30/15 1009  BP: 138/74  Pulse: 70  Temp: 36.9 C  Resp: 19    Complications: No apparent anesthesia complications

## 2015-09-02 ENCOUNTER — Ambulatory Visit (HOSPITAL_COMMUNITY): Payer: Self-pay

## 2017-02-26 ENCOUNTER — Other Ambulatory Visit (HOSPITAL_COMMUNITY): Payer: Self-pay | Admitting: Physician Assistant

## 2017-02-26 DIAGNOSIS — Z1231 Encounter for screening mammogram for malignant neoplasm of breast: Secondary | ICD-10-CM

## 2017-03-15 DIAGNOSIS — D235 Other benign neoplasm of skin of trunk: Secondary | ICD-10-CM | POA: Insufficient documentation

## 2017-03-15 DIAGNOSIS — D234 Other benign neoplasm of skin of scalp and neck: Secondary | ICD-10-CM | POA: Insufficient documentation

## 2017-03-22 ENCOUNTER — Ambulatory Visit (HOSPITAL_COMMUNITY)
Admission: RE | Admit: 2017-03-22 | Discharge: 2017-03-22 | Disposition: A | Discharge status: Home / Self Care | Payer: 59 | Source: Ambulatory Visit | Attending: Physician Assistant | Admitting: Physician Assistant

## 2017-03-22 DIAGNOSIS — Z1231 Encounter for screening mammogram for malignant neoplasm of breast: Secondary | ICD-10-CM | POA: Insufficient documentation

## 2018-05-19 ENCOUNTER — Other Ambulatory Visit (HOSPITAL_COMMUNITY): Payer: Self-pay | Admitting: Physician Assistant

## 2018-05-19 DIAGNOSIS — Z1231 Encounter for screening mammogram for malignant neoplasm of breast: Secondary | ICD-10-CM

## 2018-05-26 ENCOUNTER — Ambulatory Visit (HOSPITAL_COMMUNITY)
Admission: RE | Admit: 2018-05-26 | Discharge: 2018-05-26 | Disposition: A | Discharge status: Home / Self Care | Payer: BLUE CROSS/BLUE SHIELD | Source: Ambulatory Visit | Attending: Physician Assistant | Admitting: Physician Assistant

## 2018-05-26 ENCOUNTER — Encounter (HOSPITAL_COMMUNITY): Payer: Self-pay

## 2018-05-26 DIAGNOSIS — Z1231 Encounter for screening mammogram for malignant neoplasm of breast: Secondary | ICD-10-CM | POA: Insufficient documentation

## 2018-05-30 DIAGNOSIS — E7800 Pure hypercholesterolemia, unspecified: Secondary | ICD-10-CM | POA: Insufficient documentation

## 2018-05-30 DIAGNOSIS — I10 Essential (primary) hypertension: Secondary | ICD-10-CM | POA: Insufficient documentation

## 2018-06-30 ENCOUNTER — Other Ambulatory Visit: Payer: Self-pay | Admitting: Physician Assistant

## 2018-06-30 ENCOUNTER — Ambulatory Visit
Admission: RE | Admit: 2018-06-30 | Discharge: 2018-06-30 | Disposition: A | Discharge status: Home / Self Care | Payer: BLUE CROSS/BLUE SHIELD | Source: Ambulatory Visit | Attending: Physician Assistant | Admitting: Physician Assistant

## 2018-06-30 DIAGNOSIS — R52 Pain, unspecified: Secondary | ICD-10-CM

## 2019-02-02 DIAGNOSIS — N760 Acute vaginitis: Secondary | ICD-10-CM | POA: Insufficient documentation

## 2019-03-02 DIAGNOSIS — M19042 Primary osteoarthritis, left hand: Secondary | ICD-10-CM | POA: Insufficient documentation

## 2019-08-03 ENCOUNTER — Other Ambulatory Visit (HOSPITAL_COMMUNITY): Payer: Self-pay | Admitting: Physician Assistant

## 2019-08-03 DIAGNOSIS — Z1231 Encounter for screening mammogram for malignant neoplasm of breast: Secondary | ICD-10-CM

## 2019-08-14 ENCOUNTER — Other Ambulatory Visit: Payer: Self-pay

## 2019-08-14 ENCOUNTER — Ambulatory Visit (HOSPITAL_COMMUNITY)
Admission: RE | Admit: 2019-08-14 | Discharge: 2019-08-14 | Disposition: A | Discharge status: Home / Self Care | Payer: BC Managed Care – PPO | Source: Ambulatory Visit | Attending: Physician Assistant | Admitting: Physician Assistant

## 2019-08-14 DIAGNOSIS — Z1231 Encounter for screening mammogram for malignant neoplasm of breast: Secondary | ICD-10-CM

## 2019-08-18 ENCOUNTER — Other Ambulatory Visit (HOSPITAL_COMMUNITY): Payer: Self-pay | Admitting: Physician Assistant

## 2019-08-18 DIAGNOSIS — R928 Other abnormal and inconclusive findings on diagnostic imaging of breast: Secondary | ICD-10-CM

## 2019-08-25 ENCOUNTER — Ambulatory Visit (HOSPITAL_COMMUNITY): Admission: RE | Admit: 2019-08-25 | Payer: BC Managed Care – PPO | Source: Ambulatory Visit

## 2019-08-25 ENCOUNTER — Ambulatory Visit (HOSPITAL_COMMUNITY)
Admission: RE | Admit: 2019-08-25 | Discharge: 2019-08-25 | Disposition: A | Discharge status: Home / Self Care | Payer: BC Managed Care – PPO | Source: Ambulatory Visit | Attending: Physician Assistant | Admitting: Physician Assistant

## 2019-08-25 ENCOUNTER — Other Ambulatory Visit: Payer: Self-pay

## 2019-08-25 ENCOUNTER — Ambulatory Visit (HOSPITAL_COMMUNITY): Payer: BC Managed Care – PPO

## 2019-08-25 DIAGNOSIS — R928 Other abnormal and inconclusive findings on diagnostic imaging of breast: Secondary | ICD-10-CM | POA: Diagnosis not present

## 2020-11-07 ENCOUNTER — Other Ambulatory Visit (HOSPITAL_COMMUNITY): Payer: Self-pay | Admitting: Physician Assistant

## 2020-11-07 DIAGNOSIS — Z1231 Encounter for screening mammogram for malignant neoplasm of breast: Secondary | ICD-10-CM

## 2020-11-18 ENCOUNTER — Ambulatory Visit (HOSPITAL_COMMUNITY)
Admission: RE | Admit: 2020-11-18 | Discharge: 2020-11-18 | Disposition: A | Discharge status: Home / Self Care | Payer: BC Managed Care – PPO | Source: Ambulatory Visit | Attending: Physician Assistant | Admitting: Physician Assistant

## 2020-11-18 ENCOUNTER — Other Ambulatory Visit: Payer: Self-pay

## 2020-11-18 DIAGNOSIS — Z1231 Encounter for screening mammogram for malignant neoplasm of breast: Secondary | ICD-10-CM | POA: Insufficient documentation

## 2021-09-06 ENCOUNTER — Other Ambulatory Visit: Payer: Self-pay

## 2021-09-06 ENCOUNTER — Ambulatory Visit (INDEPENDENT_AMBULATORY_CARE_PROVIDER_SITE_OTHER): Payer: BC Managed Care – PPO

## 2021-09-06 ENCOUNTER — Ambulatory Visit (INDEPENDENT_AMBULATORY_CARE_PROVIDER_SITE_OTHER): Payer: BC Managed Care – PPO | Admitting: Podiatry

## 2021-09-06 DIAGNOSIS — M79671 Pain in right foot: Secondary | ICD-10-CM | POA: Diagnosis not present

## 2021-09-06 DIAGNOSIS — M2011 Hallux valgus (acquired), right foot: Secondary | ICD-10-CM | POA: Diagnosis not present

## 2021-09-06 DIAGNOSIS — Z01818 Encounter for other preprocedural examination: Secondary | ICD-10-CM

## 2021-09-06 DIAGNOSIS — M21619 Bunion of unspecified foot: Secondary | ICD-10-CM

## 2021-09-12 ENCOUNTER — Encounter: Payer: Self-pay | Admitting: Podiatry

## 2021-09-12 NOTE — Progress Notes (Signed)
Subjective:  Patient ID: [name] [name], female    DOB: [birth year],  MRN: [id number]  No chief complaint on file.   60 y.o. female presents with the above complaint.  Patient presents with primary complaint of bilateral bunion deformity right much greater than left side.  She states been going on for quite some time has progressed to gotten worse.  She has failed all conservative treatment options including making shoe gear modification padding protecting.  She would like to discuss surgical options at this time.  Is painful to walk on painful to touch pain is 7 out of 10 dull aching nature.  Shoes are the aggravating factors.  She would like to discuss options to surgically correct.  Review of Systems: Negative except as noted in the HPI. Denies N/V/F/Ch.  Past Medical History:  Diagnosis Date   Parotid neoplasm    left    Current Outpatient Medications:    DOCUSATE SODIUM PO, Take 100 mg by mouth daily as needed (constipation). , Disp: , Rfl:    EST ESTROGENS-METHYLTEST HS 0.625-1.25 MG tablet, Take 1 tablet by mouth daily., Disp: , Rfl: 0   HYDROcodone-acetaminophen (NORCO/VICODIN) 5-325 MG tablet, Take 1-2 tablets by mouth every 6 (six) hours as needed for moderate pain or severe pain. , Disp: , Rfl: 0   Multiple Vitamin (MULTIVITAMIN) capsule, Take 1 capsule by mouth daily., Disp: , Rfl:   Social History   Tobacco Use  Smoking Status Some Days   Packs/day: 0.50   Years: 35.00   Pack years: 17.50   Types: Cigarettes   Last attempt to quit: 08/23/2015   Years since quitting: 6.0  Smokeless Tobacco Not on file     Objective:  There were no vitals filed for this visit. There is no height or weight on file to calculate BMI. Constitutional Well developed. Well nourished.  Vascular Dorsalis pedis pulses palpable bilaterally. Posterior tibial pulses palpable bilaterally. Capillary refill normal to all digits.  No cyanosis or clubbing noted. Pedal hair growth normal.   Neurologic Normal speech. Oriented to person, place, and time. Epicritic sensation to light touch grossly present bilaterally.  Dermatologic Nails well groomed and normal in appearance. No open wounds. No skin lesions.  Orthopedic: Normal joint ROM without pain or crepitus bilaterally. Hallux abductovalgus deformity present moderate bunion deformity present.  No intra-articular first MPJ pain noted.  No crepitus noted.  This is a track bound not a tracking deformity Left 1st MPJ diminished range of motion. Left 1st TMT without gross hypermobility. Right 1st MPJ diminished range of motion  Right 1st TMT without gross hypermobility. Lesser digital contractures absent bilaterally.   Radiographs: Taken and reviewed. Hallux abductovalgus deformity present. Metatarsal parabola normal. 1st/2nd IMA: Moderate less than 15 degrees; TSP: 5 out of 7  Assessment:   1. Hallux abducto valgus, right   2. Acquired hallux interphalangeus, right   3. Preoperative examination    Plan:  Patient was evaluated and treated and all questions answered.  Hallux abductovalgus deformity, right -XR as above. -Patient has failed all conservative therapy and wishes to proceed with surgical intervention. All risks, benefits, and alternatives discussed with patient. No guarantees given. Consent reviewed and signed by patient. Post-op course explained at length. -Planned procedures: Chevron osteotomy with a possible phalangeal osteotomy with fixation -Risk factors: Age -I discussed my preoperative intraoperative and postoperative plan in extensive detail.  Given the amount of pain that she is having in the setting of failed conservative treatment options I believe she  will benefit from a surgical correction of bunion.  I discussed with her that she will benefit from a chevron osteotomy with possible phalangeal osteotomy with fixation.  She agrees with the plan like to proceed with surgery.  She will be weightbearing  as tolerated in cam boot after the surgery. -Cam boot was dispensed -Informed surgical risk consent was reviewed and read aloud to the patient.  I reviewed the films.  I have discussed my findings with the patient in great detail.  I have discussed all risks including but not limited to infection, stiffness, scarring, limp, disability, deformity, damage to blood vessels and nerves, numbness, poor healing, need for braces, arthritis, chronic pain, amputation, death.  All benefits and realistic expectations discussed in great detail.  I have made no promises as to the outcome.  I have provided realistic expectations.  I have offered the patient a 2nd opinion, which they have declined and assured me they preferred to proceed despite the risks   No follow-ups on file.

## 2021-09-29 ENCOUNTER — Telehealth: Payer: Self-pay | Admitting: Podiatry

## 2021-09-29 NOTE — Telephone Encounter (Signed)
DOS: 10/23/2021  [place] Bunionectomy Right - [id number]  BCBS Effective: 06/11/2021  Deductible: $200  Out of Pocket: $500  CoInsurance: 20% Copay: $0  Per Haynes Kerns Prior Auth is NOT required.  Call Ref# [id number]

## 2021-10-23 ENCOUNTER — Encounter: Payer: Self-pay | Admitting: Podiatry

## 2021-10-23 ENCOUNTER — Other Ambulatory Visit: Payer: Self-pay | Admitting: Podiatry

## 2021-10-23 DIAGNOSIS — M2011 Hallux valgus (acquired), right foot: Secondary | ICD-10-CM | POA: Diagnosis not present

## 2021-10-23 MED ORDER — OXYCODONE-ACETAMINOPHEN 5-325 MG PO TABS: 1.0000 | ORAL_TABLET | ORAL | 0 refills | Status: DC | PRN

## 2021-10-23 MED ORDER — IBUPROFEN 800 MG PO TABS: 800.0000 mg | ORAL_TABLET | Freq: Four times a day (QID) | ORAL | 1 refills | Status: DC | PRN

## 2021-10-24 ENCOUNTER — Telehealth: Payer: Self-pay | Admitting: *Deleted

## 2021-10-24 ENCOUNTER — Other Ambulatory Visit: Payer: Self-pay | Admitting: Podiatry

## 2021-10-24 MED ORDER — OXYCODONE-ACETAMINOPHEN 5-325 MG PO TABS: 1.0000 | ORAL_TABLET | ORAL | 0 refills | Status: DC | PRN

## 2021-10-24 NOTE — Telephone Encounter (Addendum)
Patient medication has been sent to wrong pharmacy, have called and cancelled at the [facility], please resend to [facility], on file.

## 2021-10-24 NOTE — Telephone Encounter (Incomplete Revision)
Patient medication has been sent to wrong pharmacy, have called and cancelled at the [facility], please resend to [facility], on file.

## 2021-11-01 ENCOUNTER — Other Ambulatory Visit: Payer: Self-pay

## 2021-11-01 ENCOUNTER — Ambulatory Visit (INDEPENDENT_AMBULATORY_CARE_PROVIDER_SITE_OTHER): Payer: BC Managed Care – PPO

## 2021-11-01 ENCOUNTER — Ambulatory Visit (INDEPENDENT_AMBULATORY_CARE_PROVIDER_SITE_OTHER): Payer: BC Managed Care – PPO | Admitting: Podiatry

## 2021-11-01 DIAGNOSIS — M2011 Hallux valgus (acquired), right foot: Secondary | ICD-10-CM

## 2021-11-01 DIAGNOSIS — Z9889 Other specified postprocedural states: Secondary | ICD-10-CM

## 2021-11-01 MED ORDER — OXYCODONE-ACETAMINOPHEN 5-325 MG PO TABS
1.0000 | ORAL_TABLET | ORAL | 0 refills | Status: DC | PRN
Start: 2021-11-01 — End: 2023-12-11

## 2021-11-01 NOTE — Progress Notes (Signed)
Subjective:  Patient ID: [name] [name], female    DOB: 04/08/1961,  MRN: [id number]  Chief Complaint  Patient presents with   Routine Post Op    POV #1 DOS 10/23/2021 RT BUNIONECTOMY    DOS: 10/23/2021 Procedure: Right foot bunionectomy with phalangeal osteotomy  61 y.o. female returns for post-op check.  Patient states she is doing well.  Pain is controlled with pain medication.  Bandages clean dry and intact.  She is ambulating with the boot.  Review of Systems: Negative except as noted in the HPI. Denies N/V/F/Ch.  Past Medical History:  Diagnosis Date   Parotid neoplasm    left    Current Outpatient Medications:    oxyCODONE-acetaminophen (PERCOCET) 5-325 MG tablet, Take 1 tablet by mouth every 4 (four) hours as needed for severe pain., Disp: 30 tablet, Rfl: 0   amLODipine (NORVASC) 5 MG tablet, 1 tablet, Disp: , Rfl:    chlorpheniramine (ALLERGY RELIEF) 4 MG tablet, 1 tablet as needed, Disp: , Rfl:    docusate sodium (COLACE) 100 MG capsule, 1 tablet, Disp: , Rfl:    ELDERBERRY PO, , Disp: , Rfl:    estrogen-methylTESTOSTERone 0.625-1.25 MG tablet, 1 tablet, Disp: , Rfl:    hydrochlorothiazide (HYDRODIURIL) 25 MG tablet, 1 tablet in the morning, Disp: , Rfl:    meloxicam (MOBIC) 15 MG tablet, 1 tablet, Disp: , Rfl:    Multiple Vitamin (MULTIVITAMIN ADULT PO), 1 tablet, Disp: , Rfl:    Multiple Vitamin (MULTIVITAMIN) capsule, Take 1 capsule by mouth daily., Disp: , Rfl:    Noni, Morinda citrifolia, (NONI PO), 1 capsule, Disp: , Rfl:    Omega-3 1000 MG CAPS, Take by mouth., Disp: , Rfl:    oxyCODONE-acetaminophen (PERCOCET) 5-325 MG tablet, Take 1 tablet by mouth every 4 (four) hours as needed for severe pain., Disp: 30 tablet, Rfl: 0   oxyCODONE-acetaminophen (PERCOCET) 5-325 MG tablet, Take 1 tablet by mouth every 4 (four) hours as needed for severe pain., Disp: 30 tablet, Rfl: 0   UNABLE TO FIND, Take by mouth., Disp: , Rfl:    UNABLE TO FIND, Take by mouth., Disp: , Rfl:     UNABLE TO FIND, Omega 3 Fatty Acids-Vitamin E 1000U, Disp: , Rfl:   Social History   Tobacco Use  Smoking Status Some Days   Packs/day: 0.50   Years: 35.00   Pack years: 17.50   Types: Cigarettes   Last attempt to quit: 08/23/2015   Years since quitting: 6.1  Smokeless Tobacco Not on file     Objective:  There were no vitals filed for this visit. There is no height or weight on file to calculate BMI. Constitutional Well developed. Well nourished.  Vascular Foot warm and well perfused. Capillary refill normal to all digits.   Neurologic Normal speech. Oriented to person, place, and time. Epicritic sensation to light touch grossly present bilaterally.  Dermatologic Skin healing well without signs of infection. Skin edges well coapted without signs of infection.  Orthopedic: Tenderness to palpation noted about the surgical site.   Radiographs: 3 views of skeletally mature right foot: Hardware is intact no signs of loosening or backing out noted.  Good correction alignment noted. Assessment:   1. Hallux abducto valgus, right   2. Status post foot surgery   3. Acquired hallux interphalangeus, right    Plan:  Patient was evaluated and treated and all questions answered.  S/p foot surgery right -Progressing as expected post-operatively. -XR: See above -WB Status: Weightbearing as  tolerated in cam boot -Sutures: Intact.  No clinical signs of dehiscence noted.  No complication noted. -Medications: Percocet -Foot redressed.  No follow-ups on file.

## 2021-11-15 ENCOUNTER — Encounter: Payer: BC Managed Care – PPO | Admitting: Podiatry

## 2021-11-17 ENCOUNTER — Other Ambulatory Visit: Payer: Self-pay

## 2021-11-17 ENCOUNTER — Ambulatory Visit (INDEPENDENT_AMBULATORY_CARE_PROVIDER_SITE_OTHER): Payer: BC Managed Care – PPO | Admitting: Podiatry

## 2021-11-17 DIAGNOSIS — Z9889 Other specified postprocedural states: Secondary | ICD-10-CM

## 2021-11-17 DIAGNOSIS — M2011 Hallux valgus (acquired), right foot: Secondary | ICD-10-CM

## 2021-11-17 NOTE — Progress Notes (Signed)
Subjective:  Patient ID: [name] [name], female    DOB: November 27, 1960,  MRN: [id number]  Chief Complaint  Patient presents with   Routine Post Op    POV #1 DOS 10/23/2021 RT BUNIONECTOMY    DOS: 10/23/2021 Procedure: Right foot bunionectomy with phalangeal osteotomy  61 y.o. female returns for post-op check.  Patient states she is doing well.  Pain is managed.  She is ambulating in boot.  Bandages clean dry and intact  Review of Systems: Negative except as noted in the HPI. Denies N/V/F/Ch.  Past Medical History:  Diagnosis Date   Parotid neoplasm    left    Current Outpatient Medications:    amLODipine (NORVASC) 5 MG tablet, 1 tablet, Disp: , Rfl:    chlorpheniramine (ALLERGY RELIEF) 4 MG tablet, 1 tablet as needed, Disp: , Rfl:    docusate sodium (COLACE) 100 MG capsule, 1 tablet, Disp: , Rfl:    ELDERBERRY PO, , Disp: , Rfl:    estrogen-methylTESTOSTERone 0.625-1.25 MG tablet, 1 tablet, Disp: , Rfl:    hydrochlorothiazide (HYDRODIURIL) 25 MG tablet, 1 tablet in the morning, Disp: , Rfl:    meloxicam (MOBIC) 15 MG tablet, 1 tablet, Disp: , Rfl:    Multiple Vitamin (MULTIVITAMIN ADULT PO), 1 tablet, Disp: , Rfl:    Multiple Vitamin (MULTIVITAMIN) capsule, Take 1 capsule by mouth daily., Disp: , Rfl:    Noni, Morinda citrifolia, (NONI PO), 1 capsule, Disp: , Rfl:    Omega-3 1000 MG CAPS, Take by mouth., Disp: , Rfl:    oxyCODONE-acetaminophen (PERCOCET) 5-325 MG tablet, Take 1 tablet by mouth every 4 (four) hours as needed for severe pain., Disp: 30 tablet, Rfl: 0   oxyCODONE-acetaminophen (PERCOCET) 5-325 MG tablet, Take 1 tablet by mouth every 4 (four) hours as needed for severe pain., Disp: 30 tablet, Rfl: 0   oxyCODONE-acetaminophen (PERCOCET) 5-325 MG tablet, Take 1 tablet by mouth every 4 (four) hours as needed for severe pain., Disp: 30 tablet, Rfl: 0   UNABLE TO FIND, Take by mouth., Disp: , Rfl:    UNABLE TO FIND, Take by mouth., Disp: , Rfl:    UNABLE TO FIND, Omega 3  Fatty Acids-Vitamin E 1000U, Disp: , Rfl:   Social History   Tobacco Use  Smoking Status Some Days   Packs/day: 0.50   Years: 35.00   Pack years: 17.50   Types: Cigarettes   Last attempt to quit: 08/23/2015   Years since quitting: 6.2  Smokeless Tobacco Not on file     Objective:  There were no vitals filed for this visit. There is no height or weight on file to calculate BMI. Constitutional Well developed. Well nourished.  Vascular Foot warm and well perfused. Capillary refill normal to all digits.   Neurologic Normal speech. Oriented to person, place, and time. Epicritic sensation to light touch grossly present bilaterally.  Dermatologic Skin completely reepithelialized.  No signs of dehiscence noted.  Good correction alignment noted.  Orthopedic: No further tenderness to palpation noted about the surgical site.   Radiographs: 3 views of skeletally mature right foot: Hardware is intact no signs of loosening or backing out noted.  Good correction alignment noted. Assessment:   1. Hallux abducto valgus, right   2. Status post foot surgery     Plan:  Patient was evaluated and treated and all questions answered.  S/p foot surgery right -Progressing as expected post-operatively. -XR: See above -WB Status: Transition to regular shoes t -Sutures: Removed no clinical signs of  dehiscence noted.  No complication noted. -Medications: Percocet -Foot redressed.  No follow-ups on file.

## 2021-12-15 ENCOUNTER — Ambulatory Visit (INDEPENDENT_AMBULATORY_CARE_PROVIDER_SITE_OTHER): Payer: BC Managed Care – PPO

## 2021-12-15 ENCOUNTER — Other Ambulatory Visit: Payer: Self-pay

## 2021-12-15 ENCOUNTER — Ambulatory Visit (INDEPENDENT_AMBULATORY_CARE_PROVIDER_SITE_OTHER): Payer: BC Managed Care – PPO | Admitting: Podiatry

## 2021-12-15 ENCOUNTER — Other Ambulatory Visit (HOSPITAL_COMMUNITY): Payer: Self-pay | Admitting: Physician Assistant

## 2021-12-15 DIAGNOSIS — M2011 Hallux valgus (acquired), right foot: Secondary | ICD-10-CM

## 2021-12-15 DIAGNOSIS — Z9889 Other specified postprocedural states: Secondary | ICD-10-CM

## 2021-12-15 DIAGNOSIS — Z1231 Encounter for screening mammogram for malignant neoplasm of breast: Secondary | ICD-10-CM

## 2021-12-20 ENCOUNTER — Other Ambulatory Visit: Payer: Self-pay | Admitting: Podiatry

## 2021-12-20 MED ORDER — OXYCODONE-ACETAMINOPHEN 5-325 MG PO TABS
1.0000 | ORAL_TABLET | ORAL | 0 refills | Status: DC | PRN
Start: 2021-12-20 — End: 2023-12-11

## 2021-12-20 NOTE — Progress Notes (Signed)
?Subjective:  ?Patient ID: [name] [name], female    DOB: 1961/07/20,  MRN: [id number] ? ?Chief Complaint  ?Patient presents with  ? Routine Post Op  ?   POV #3 DOS 10/23/2021 RT BUNIONECTOMY  ? ? ?DOS: 10/23/2021 ?Procedure: Right bunionectomy ? ?61 y.o. female returns for post-op check.  Patient states that she is doing well.  Pain is controlled.  No swelling.  Bandages clean dry and intact.  Ambulating with a cam boot. ? ?Review of Systems: Negative except as noted in the HPI. Denies N/V/F/Ch. ? ?Past Medical History:  ?Diagnosis Date  ? Parotid neoplasm   ? left  ? ? ?Current Outpatient Medications:  ?  amLODipine (NORVASC) 5 MG tablet, 1 tablet, Disp: , Rfl:  ?  chlorpheniramine (ALLERGY RELIEF) 4 MG tablet, 1 tablet as needed, Disp: , Rfl:  ?  docusate sodium (COLACE) 100 MG capsule, 1 tablet, Disp: , Rfl:  ?  ELDERBERRY PO, , Disp: , Rfl:  ?  estrogen-methylTESTOSTERone 0.625-1.25 MG tablet, 1 tablet, Disp: , Rfl:  ?  hydrochlorothiazide (HYDRODIURIL) 25 MG tablet, 1 tablet in the morning, Disp: , Rfl:  ?  meloxicam (MOBIC) 15 MG tablet, 1 tablet, Disp: , Rfl:  ?  Multiple Vitamin (MULTIVITAMIN ADULT PO), 1 tablet, Disp: , Rfl:  ?  Multiple Vitamin (MULTIVITAMIN) capsule, Take 1 capsule by mouth daily., Disp: , Rfl:  ?  Noni, Morinda citrifolia, (NONI PO), 1 capsule, Disp: , Rfl:  ?  Omega-3 1000 MG CAPS, Take by mouth., Disp: , Rfl:  ?  oxyCODONE-acetaminophen (PERCOCET) 5-325 MG tablet, Take 1 tablet by mouth every 4 (four) hours as needed for severe pain., Disp: 30 tablet, Rfl: 0 ?  oxyCODONE-acetaminophen (PERCOCET) 5-325 MG tablet, Take 1 tablet by mouth every 4 (four) hours as needed for severe pain., Disp: 30 tablet, Rfl: 0 ?  oxyCODONE-acetaminophen (PERCOCET) 5-325 MG tablet, Take 1 tablet by mouth every 4 (four) hours as needed for severe pain., Disp: 30 tablet, Rfl: 0 ?  oxyCODONE-acetaminophen (PERCOCET) 5-325 MG tablet, Take 1 tablet by mouth every 4 (four) hours as needed for severe pain., Disp: 30  tablet, Rfl: 0 ?  UNABLE TO FIND, Take by mouth., Disp: , Rfl:  ?  UNABLE TO FIND, Take by mouth., Disp: , Rfl:  ?  UNABLE TO FIND, Omega 3 Fatty Acids-Vitamin E 1000U, Disp: , Rfl:  ? ?Social History  ? ?Tobacco Use  ?Smoking Status Some Days  ? Packs/day: 0.50  ? Years: 35.00  ? Pack years: 17.50  ? Types: Cigarettes  ? Last attempt to quit: 08/23/2015  ? Years since quitting: 6.3  ?Smokeless Tobacco Not on file  ? ? ? ?Objective:  ?There were no vitals filed for this visit. ?There is no height or weight on file to calculate BMI. ?Constitutional Well developed. ?Well nourished.  ?Vascular Foot warm and well perfused. ?Capillary refill normal to all digits.   ?Neurologic Normal speech. ?Oriented to person, place, and time. ?Epicritic sensation to light touch grossly present bilaterally.  ?Dermatologic Skin healing well without signs of infection. Skin edges well coapted without signs of infection.  ?Orthopedic: Tenderness to palpation noted about the surgical site.  ? ?Radiographs: 3 views of skeletally mature the right foot:Screws are intact no signs of backing out or loosening noted.  Hardware is intact. ?Assessment:  ? ?1. Status post foot surgery   ?2. Hallux abducto valgus, right   ? ?Plan:  ?Patient was evaluated and treated and all questions answered. ? ?  S/p foot surgery right ?-Progressing as expected post-operatively. ?-XR: See above ?-WB Status: Weightbearing as tolerated in surgical shoe ?-Sutures: Intact.  Superficial dehiscence noted.  No deep exposure of hardware noted.  Betadine wet-to-dry dressing will be applied daily ?-Medications: None ?-Foot redressed. ? ?No follow-ups on file.  ?

## 2021-12-22 ENCOUNTER — Other Ambulatory Visit: Payer: Self-pay

## 2021-12-22 ENCOUNTER — Ambulatory Visit (HOSPITAL_COMMUNITY)
Admission: RE | Admit: 2021-12-22 | Discharge: 2021-12-22 | Disposition: A | Discharge status: Home / Self Care | Payer: BC Managed Care – PPO | Source: Ambulatory Visit | Attending: Physician Assistant | Admitting: Physician Assistant

## 2021-12-22 DIAGNOSIS — Z1231 Encounter for screening mammogram for malignant neoplasm of breast: Secondary | ICD-10-CM | POA: Insufficient documentation

## 2022-01-05 ENCOUNTER — Ambulatory Visit (INDEPENDENT_AMBULATORY_CARE_PROVIDER_SITE_OTHER): Payer: BC Managed Care – PPO | Admitting: Podiatry

## 2022-01-05 ENCOUNTER — Other Ambulatory Visit: Payer: Self-pay

## 2022-01-05 DIAGNOSIS — M2011 Hallux valgus (acquired), right foot: Secondary | ICD-10-CM

## 2022-01-05 DIAGNOSIS — Z9889 Other specified postprocedural states: Secondary | ICD-10-CM

## 2022-01-09 NOTE — Progress Notes (Signed)
?Subjective:  ?Patient ID: [name] [name], female    DOB: [birth year],  MRN: [id number] ? ?Chief Complaint  ?Patient presents with  ? Routine Post Op  ?  POV #4 DOS 10/23/2021 RT BUNIONECTOMY  ? ? ?DOS: 10/23/2021 ?Procedure: Right bunionectomy ? ?61 y.o. female returns for post-op check.  Patient states that she is doing well.  Pain is controlled.  No swelling.  Bandages clean dry and intact.  Ambulating with a cam boot. ? ?Review of Systems: Negative except as noted in the HPI. Denies N/V/F/Ch. ? ?Past Medical History:  ?Diagnosis Date  ? Parotid neoplasm   ? left  ? ? ?Current Outpatient Medications:  ?  amLODipine (NORVASC) 5 MG tablet, 1 tablet, Disp: , Rfl:  ?  chlorpheniramine (ALLERGY RELIEF) 4 MG tablet, 1 tablet as needed, Disp: , Rfl:  ?  docusate sodium (COLACE) 100 MG capsule, 1 tablet, Disp: , Rfl:  ?  ELDERBERRY PO, , Disp: , Rfl:  ?  estrogen-methylTESTOSTERone 0.625-1.25 MG tablet, 1 tablet, Disp: , Rfl:  ?  hydrochlorothiazide (HYDRODIURIL) 25 MG tablet, 1 tablet in the morning, Disp: , Rfl:  ?  meloxicam (MOBIC) 15 MG tablet, 1 tablet, Disp: , Rfl:  ?  Multiple Vitamin (MULTIVITAMIN ADULT PO), 1 tablet, Disp: , Rfl:  ?  Multiple Vitamin (MULTIVITAMIN) capsule, Take 1 capsule by mouth daily., Disp: , Rfl:  ?  Noni, Morinda citrifolia, (NONI PO), 1 capsule, Disp: , Rfl:  ?  Omega-3 1000 MG CAPS, Take by mouth., Disp: , Rfl:  ?  oxyCODONE-acetaminophen (PERCOCET) 5-325 MG tablet, Take 1 tablet by mouth every 4 (four) hours as needed for severe pain., Disp: 30 tablet, Rfl: 0 ?  oxyCODONE-acetaminophen (PERCOCET) 5-325 MG tablet, Take 1 tablet by mouth every 4 (four) hours as needed for severe pain., Disp: 30 tablet, Rfl: 0 ?  oxyCODONE-acetaminophen (PERCOCET) 5-325 MG tablet, Take 1 tablet by mouth every 4 (four) hours as needed for severe pain., Disp: 30 tablet, Rfl: 0 ?  oxyCODONE-acetaminophen (PERCOCET) 5-325 MG tablet, Take 1 tablet by mouth every 4 (four) hours as needed for severe pain., Disp: 30  tablet, Rfl: 0 ?  UNABLE TO FIND, Take by mouth., Disp: , Rfl:  ?  UNABLE TO FIND, Take by mouth., Disp: , Rfl:  ?  UNABLE TO FIND, Omega 3 Fatty Acids-Vitamin E 1000U, Disp: , Rfl:  ? ?Social History  ? ?Tobacco Use  ?Smoking Status Some Days  ? Packs/day: 0.50  ? Years: 35.00  ? Pack years: 17.50  ? Types: Cigarettes  ? Last attempt to quit: 08/23/2015  ? Years since quitting: 6.3  ?Smokeless Tobacco Not on file  ? ? ? ?Objective:  ?There were no vitals filed for this visit. ?There is no height or weight on file to calculate BMI. ?Constitutional Well developed. ?Well nourished.  ?Vascular Foot warm and well perfused. ?Capillary refill normal to all digits.   ?Neurologic Normal speech. ?Oriented to person, place, and time. ?Epicritic sensation to light touch grossly present bilaterally.  ?Dermatologic Skin completely epithelialized.  Good correction alignment noted.  Good range of motion noted at the first MPJ joint  ?Orthopedic: No further tenderness to palpation noted about the surgical site.  ? ?Radiographs: 3 views of skeletally mature the right foot:Screws are intact no signs of backing out or loosening noted.  Hardware is intact. ?Assessment:  ? ?1. Status post foot surgery   ?2. Hallux abducto valgus, right   ? ? ?Plan:  ?Patient was evaluated and  treated and all questions answered. ? ?S/p foot surgery right ?-Clinically healed and the patient is officially discharged from my care.  I discussed shoe gear modification and orthotics in extensive detail she.  She is states understanding.  She has transition to regular supportive sneakers. ? ?No follow-ups on file.  ?

## 2022-04-13 ENCOUNTER — Emergency Department (HOSPITAL_COMMUNITY)
Admission: EM | Admit: 2022-04-13 | Discharge: 2022-04-13 | Disposition: A | Discharge status: Home / Self Care | Payer: BC Managed Care – PPO | Attending: Emergency Medicine | Admitting: Emergency Medicine

## 2022-04-13 ENCOUNTER — Emergency Department (HOSPITAL_COMMUNITY): Payer: BC Managed Care – PPO

## 2022-04-13 ENCOUNTER — Encounter (HOSPITAL_COMMUNITY): Payer: Self-pay

## 2022-04-13 ENCOUNTER — Other Ambulatory Visit: Payer: Self-pay

## 2022-04-13 DIAGNOSIS — S93401A Sprain of unspecified ligament of right ankle, initial encounter: Secondary | ICD-10-CM | POA: Diagnosis not present

## 2022-04-13 DIAGNOSIS — W1839XA Other fall on same level, initial encounter: Secondary | ICD-10-CM | POA: Diagnosis not present

## 2022-04-13 DIAGNOSIS — S99911A Unspecified injury of right ankle, initial encounter: Secondary | ICD-10-CM | POA: Diagnosis present

## 2022-04-13 HISTORY — DX: Essential (primary) hypertension: I10

## 2022-04-13 NOTE — ED Triage Notes (Signed)
Pt presents to ED with complaints of right ankle pain after falling today.

## 2022-04-13 NOTE — Discharge Instructions (Signed)
Return if any problems.

## 2022-04-13 NOTE — ED Provider Notes (Signed)
[facility] EMERGENCY DEPARTMENT Provider Note   CSN: [id number] Arrival date & time: 04/13/22  1634     History  Chief Complaint  Patient presents with   Ankle Pain    [name] [name] is a 61 y.o. female.  Patient reports she fell and injured her right ankle.  Patient complains of bruising swelling and pain she has not been able to walk since the injury.  Patient denies any other areas of injury or pain.  Patient did not have any impact to her head  The history is provided by the patient. No language interpreter was used.  Ankle Pain Location:  Ankle Time since incident:  1 hour Pain details:    Timing:  Constant   Progression:  Worsening Chronicity:  New      Home Medications Prior to Admission medications   Medication Sig Start Date End Date Taking? Authorizing Provider  amLODipine (NORVASC) 5 MG tablet 1 tablet 06/01/19   [provider]  chlorpheniramine (ALLERGY RELIEF) 4 MG tablet 1 tablet as needed    [provider]  docusate sodium (COLACE) 100 MG capsule 1 tablet    [provider]  ELDERBERRY PO     [provider]  estrogen-methylTESTOSTERone 0.625-1.25 MG tablet 1 tablet    [provider]  hydrochlorothiazide (HYDRODIURIL) 25 MG tablet 1 tablet in the morning 08/11/19   [provider]  meloxicam (MOBIC) 15 MG tablet 1 tablet 07/01/19   [provider]  Multiple Vitamin (MULTIVITAMIN ADULT PO) 1 tablet    [provider]  Multiple Vitamin (MULTIVITAMIN) capsule Take 1 capsule by mouth daily.    [provider]  Noni, Morinda citrifolia, (NONI PO) 1 capsule    [provider]  Omega-3 1000 MG CAPS Take by mouth.    [provider]  oxyCODONE-acetaminophen (PERCOCET) 5-325 MG tablet Take 1 tablet by mouth every 4 (four) hours as needed for severe pain. 10/23/21   Felipa Furnace, DPM  oxyCODONE-acetaminophen (PERCOCET) 5-325 MG tablet Take 1 tablet by mouth every 4  (four) hours as needed for severe pain. 10/24/21   Felipa Furnace, DPM  oxyCODONE-acetaminophen (PERCOCET) 5-325 MG tablet Take 1 tablet by mouth every 4 (four) hours as needed for severe pain. 11/01/21   Felipa Furnace, DPM  oxyCODONE-acetaminophen (PERCOCET) 5-325 MG tablet Take 1 tablet by mouth every 4 (four) hours as needed for severe pain. 12/20/21   Felipa Furnace, DPM  UNABLE TO FIND Take by mouth.    [provider]  UNABLE TO FIND Take by mouth.    [provider]  UNABLE TO FIND Omega 3 Fatty Acids-Vitamin E 1000U    [provider]      Allergies    Penicillins    Review of Systems   Review of Systems  Musculoskeletal:  Positive for joint swelling and myalgias.  All other systems reviewed and are negative.   Physical Exam Updated Vital Signs BP (!) 143/104   Pulse (!) 101   Temp 98.3 F (36.8 C) (Oral)   Resp 18   Ht '5\' 1"'$  (1.549 m)   Wt 68 kg   SpO2 91%   BMI 28.34 kg/m  Physical Exam Vitals and nursing note reviewed.  Constitutional:      Appearance: She is well-developed.  HENT:     Head: Normocephalic.  Pulmonary:     Effort: Pulmonary effort is normal.  Abdominal:     General: There is no distension.  Musculoskeletal:        General: Swelling and tenderness present.     Comments: Swollen bruised right ankle, decreased range of motion second to pain.  Vascular neurosensory are intact  Neurological:     Mental Status: She is alert and oriented to person, place, and time.     ED Results / Procedures / Treatments   Labs (all labs ordered are listed, but only abnormal results are displayed) Labs Reviewed - No data to display  EKG None  Radiology DG Ankle Complete Right  Result Date: 04/13/2022 CLINICAL DATA:  Fall today.  Right ankle pain. EXAM: RIGHT ANKLE - COMPLETE 3+ VIEW COMPARISON:  None Available. FINDINGS: There is no evidence of fracture, dislocation, or joint effusion. There is no evidence of arthropathy or other  focal bone abnormality. Soft tissues are unremarkable. IMPRESSION: Negative. Electronically Signed   By: Marlaine Hind M.D.   On: 04/13/2022 17:40    Procedures Procedures    Medications Ordered in ED Medications - No data to display  ED Course/ Medical Decision Making/ A&P                           Medical Decision Making Patient reports she fell and turned her ankle  Amount and/or Complexity of Data Reviewed Independent Historian: spouse    Details: Patient has family present who are supportive Radiology: ordered and independent interpretation performed. Decision-making details documented in ED Course.    Details: X-ray of right ankle shows no evidence of fracture or dislocation  Risk Risk Details: Patient placed in an ASO offered crutches patient is advised to follow-up with Dr. Marlou Sa orthopedist on-call if pain persist past 1 week           Final Clinical Impression(s) / ED Diagnoses Final diagnoses:  Sprain of right ankle, unspecified ligament, initial encounter    Rx / DC Orders ED Discharge Orders     None      An After Visit Summary was printed and given to the patient.    Fransico Meadow, [place] 04/13/22 1814    Hayden Rasmussen, MD 04/14/22 [phone]

## 2022-11-02 IMAGING — MG MM DIGITAL SCREENING BILAT W/ TOMO AND CAD
8 series · 9 of 24 positions shown · non-contrast
Comparison: Previous exam(s).

CLINICAL DATA: Screening.

EXAM:
DIGITAL SCREENING BILATERAL MAMMOGRAM WITH TOMOSYNTHESIS AND CAD
TECHNIQUE: Bilateral screening digital craniocaudal and mediolateral oblique
mammograms were obtained. Bilateral screening digital breast
tomosynthesis was performed. The images were evaluated with
computer-aided detection.

[L CC synth-2D]
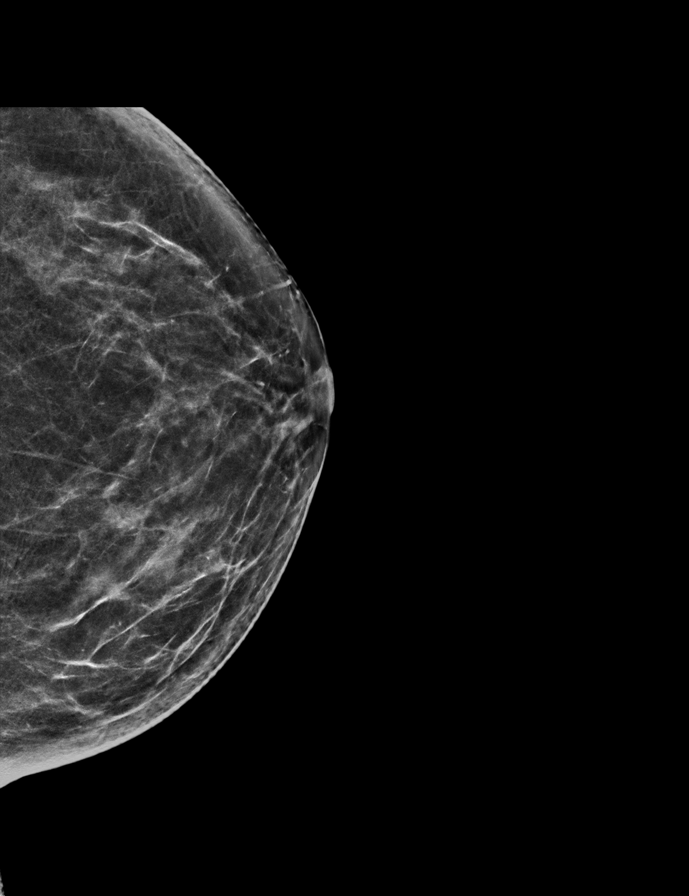

[R CC synth-2D]
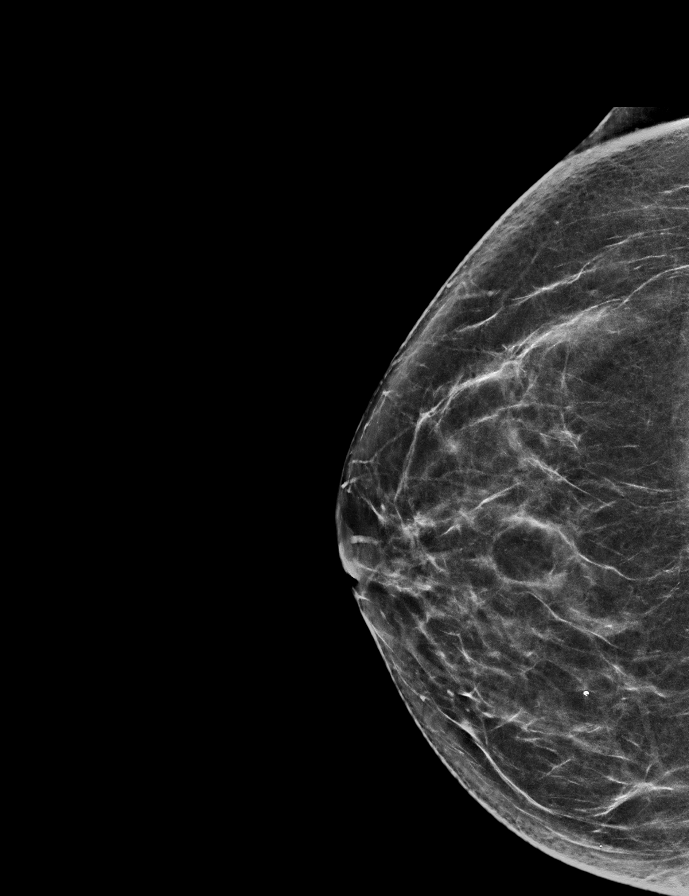

[L MLO synth-2D]
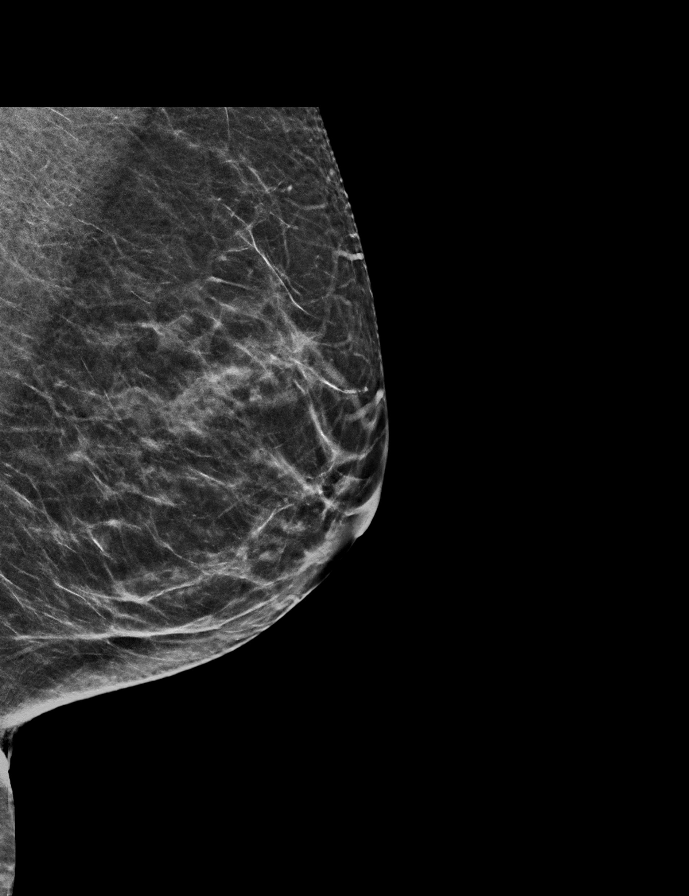

[R MLO synth-2D]
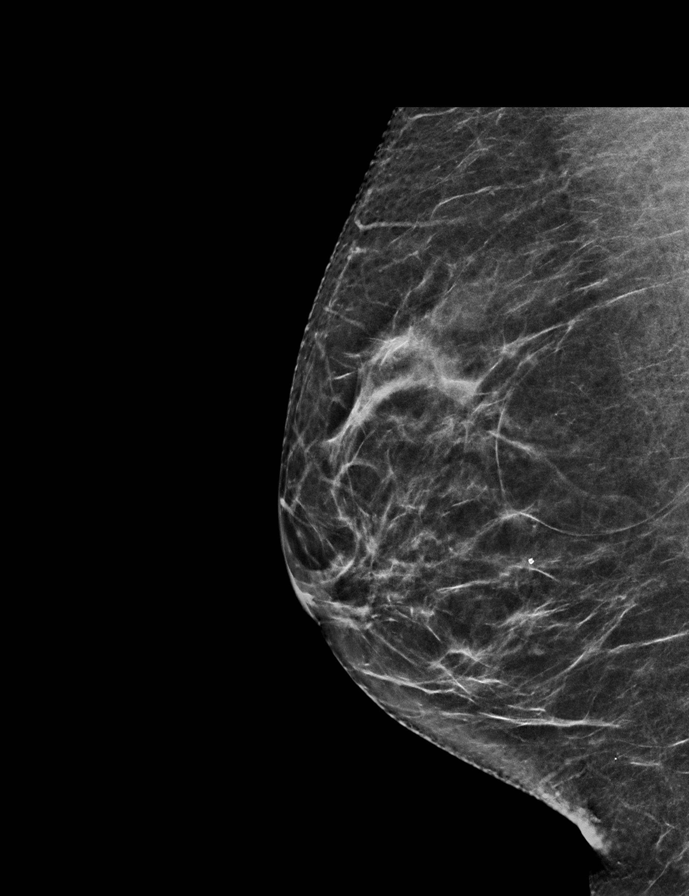

[L MLO tomo · 2 of 59 frames shown]
[frame 20/59]
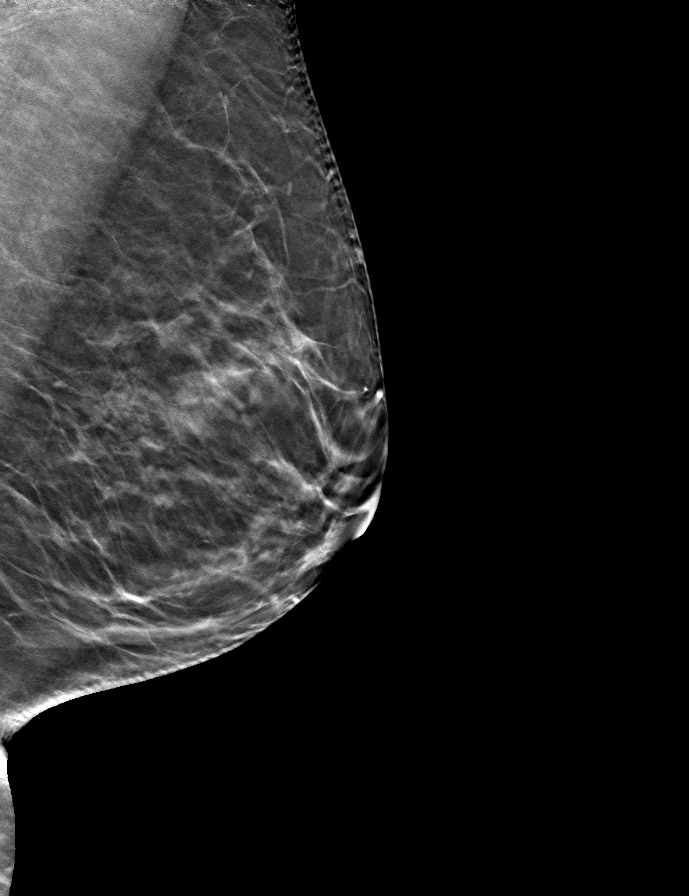
[frame 30/59]
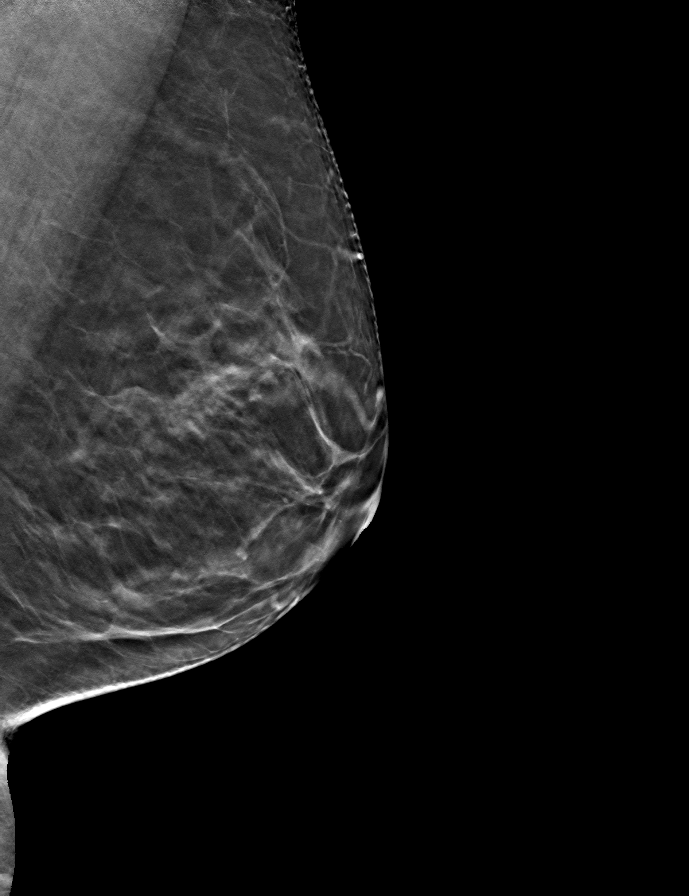

[R CC tomo · tomo slice 34/67.0]
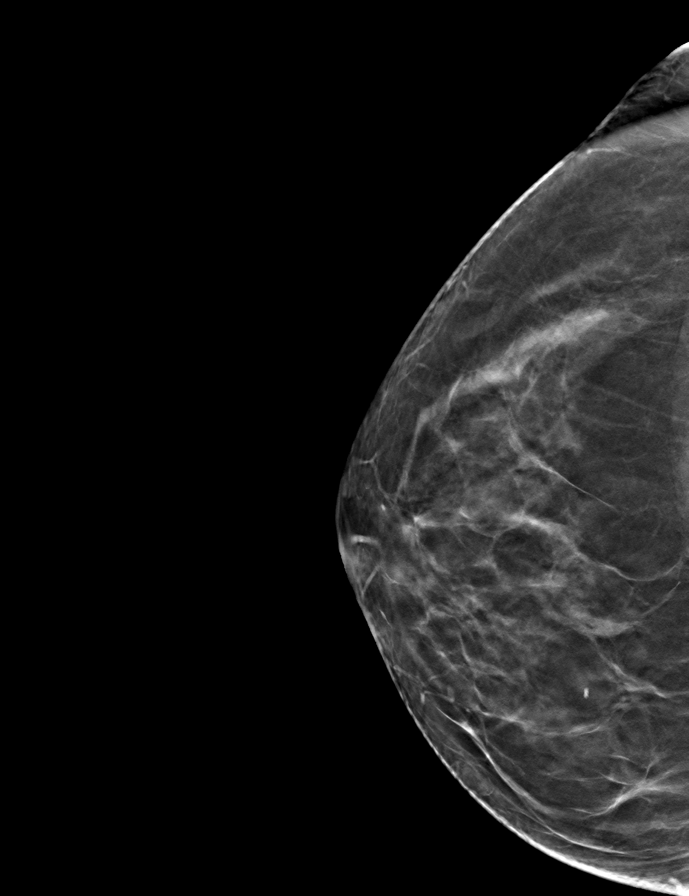

[L CC tomo · tomo slice 33/65.0]
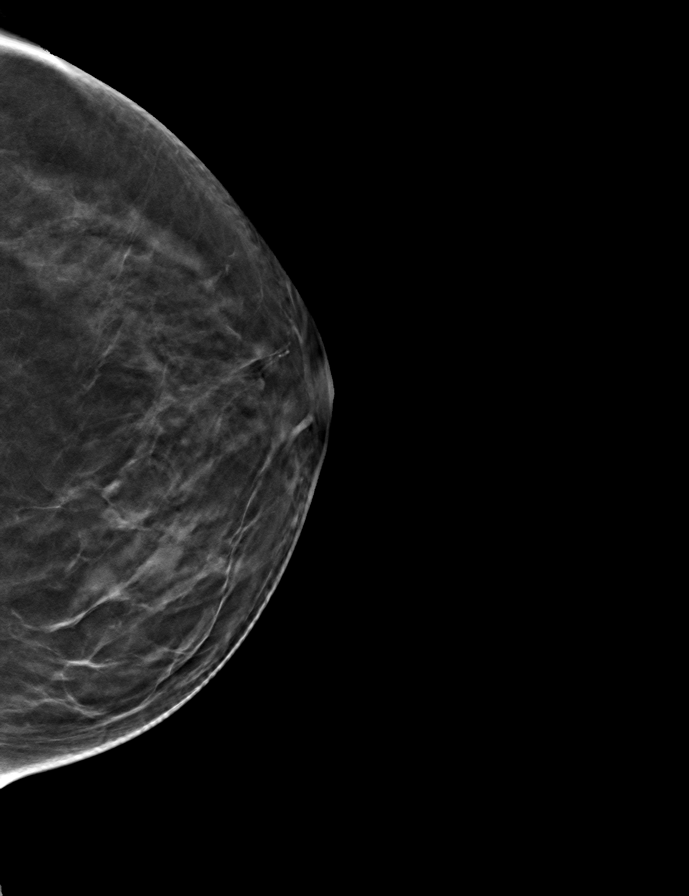

[R MLO tomo · tomo slice 31/61.0]
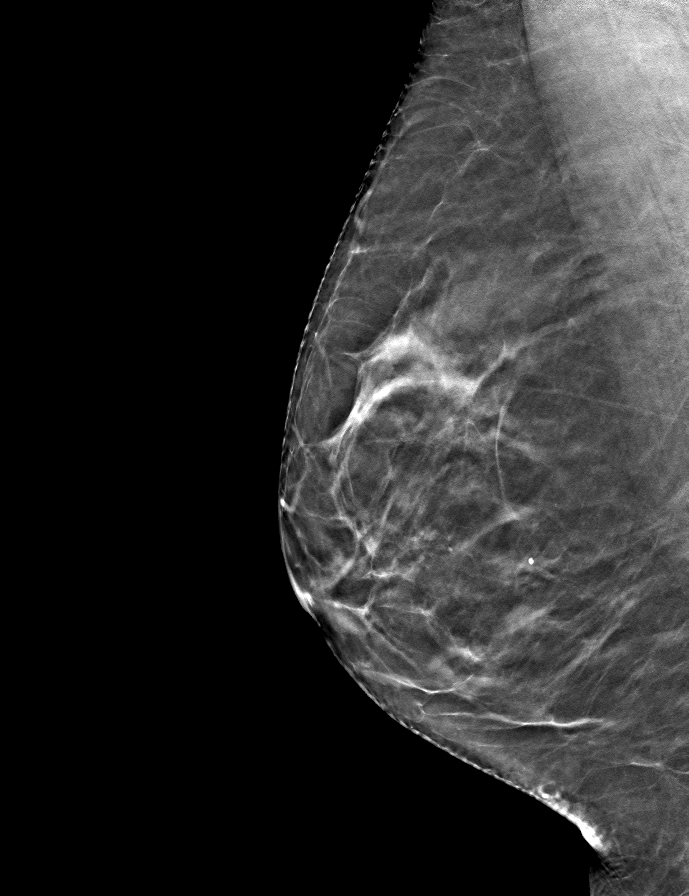

[9 of 24 positions shown; findings below may reference images not displayed]

ACR Breast Density Category b: There are scattered areas of
fibroglandular density.
FINDINGS: There are no findings suspicious for malignancy.
IMPRESSION: No mammographic evidence of malignancy. A result letter of this
screening mammogram will be mailed directly to the patient.

RECOMMENDATION:
Screening mammogram in one year. (Code:51-O-LD2)

BI-RADS CATEGORY  1: Negative.

## 2022-11-20 ENCOUNTER — Other Ambulatory Visit (HOSPITAL_COMMUNITY): Payer: Self-pay | Admitting: Physician Assistant

## 2022-11-20 DIAGNOSIS — Z1231 Encounter for screening mammogram for malignant neoplasm of breast: Secondary | ICD-10-CM

## 2022-12-28 ENCOUNTER — Encounter (HOSPITAL_COMMUNITY): Payer: Self-pay

## 2022-12-28 ENCOUNTER — Ambulatory Visit (HOSPITAL_COMMUNITY)
Admission: RE | Admit: 2022-12-28 | Discharge: 2022-12-28 | Disposition: A | Discharge status: Home / Self Care | Payer: BC Managed Care – PPO | Source: Ambulatory Visit | Attending: Physician Assistant | Admitting: Physician Assistant

## 2022-12-28 DIAGNOSIS — Z1231 Encounter for screening mammogram for malignant neoplasm of breast: Secondary | ICD-10-CM | POA: Insufficient documentation

## 2023-02-17 DIAGNOSIS — L989 Disorder of the skin and subcutaneous tissue, unspecified: Secondary | ICD-10-CM | POA: Insufficient documentation

## 2023-02-17 DIAGNOSIS — D1721 Benign lipomatous neoplasm of skin and subcutaneous tissue of right arm: Secondary | ICD-10-CM | POA: Insufficient documentation

## 2023-07-05 LAB — HM COLONOSCOPY

## 2023-11-25 ENCOUNTER — Ambulatory Visit: Payer: BC Managed Care – PPO | Admitting: Family Medicine

## 2023-11-26 ENCOUNTER — Ambulatory Visit (INDEPENDENT_AMBULATORY_CARE_PROVIDER_SITE_OTHER): Payer: Medicare HMO | Admitting: Family Medicine

## 2023-11-26 ENCOUNTER — Encounter: Payer: Self-pay | Admitting: Family Medicine

## 2023-11-26 VITALS — BP 120/80 | HR 67 | Temp 98.2°F | Ht 61.0 in | Wt 146.0 lb

## 2023-11-26 DIAGNOSIS — G8929 Other chronic pain: Secondary | ICD-10-CM | POA: Diagnosis not present

## 2023-11-26 DIAGNOSIS — M545 Low back pain, unspecified: Secondary | ICD-10-CM

## 2023-11-26 DIAGNOSIS — I10 Essential (primary) hypertension: Secondary | ICD-10-CM | POA: Diagnosis not present

## 2023-11-26 DIAGNOSIS — E7800 Pure hypercholesterolemia, unspecified: Secondary | ICD-10-CM

## 2023-11-26 DIAGNOSIS — Z7689 Persons encountering health services in other specified circumstances: Secondary | ICD-10-CM | POA: Insufficient documentation

## 2023-11-26 DIAGNOSIS — Z860100 Personal history of colon polyps, unspecified: Secondary | ICD-10-CM | POA: Insufficient documentation

## 2023-11-26 NOTE — Progress Notes (Signed)
New Patient Office Visit  Subjective    Patient ID: [name] [name], female    DOB: 1961-03-08  Age: 63 y.o. MRN: [id number]  CC:  Chief Complaint  Patient presents with   Follow-up    wants to establish care    HPI [name] [name] presents to establish care. Oriented to practice routines and expectations. Has been seeing a PCP regularly. PMH includes HTN and low back pain from DDD. Concerns include none. Exercise includes   Breast CA screening: Mammogram status: Completed 12/28/2022. Repeat every year, will schedule at [facility] Cervical CA screening:  hysterectomy due to non-cancerous reasons Colon CA screening: colonoscopy 1 years ago without abnormalities. Repeat 5y. Tobacco: former smoker, quit 6 years ago, <30 years, <0.5pack STI: declines Vaccines:  shingles UTD, declines TDap, flu, covid  HYPERTENSION without Chronic Kidney Disease Hypertension status: controlled  Satisfied with current treatment? yes Duration of hypertension: chronic BP monitoring frequency:  not checking BP range:  BP medication side effects:  no Medication compliance: excellent compliance Previous BP meds:amlodipine and HCTZ Aspirin: no Recurrent headaches: no Visual changes: no Palpitations: no Dyspnea: no Chest pain: no Lower extremity edema: no Dizzy/lightheaded: no   Outpatient Encounter Medications as of 11/26/2023  Medication Sig   amLODipine (NORVASC) 5 MG tablet 1 tablet   ELDERBERRY PO    hydrochlorothiazide (HYDRODIURIL) 25 MG tablet 1 tablet in the morning   meloxicam (MOBIC) 15 MG tablet 1 tablet   Multiple Vitamin (MULTIVITAMIN ADULT PO) 1 tablet   Omega-3 1000 MG CAPS Take by mouth.   OVER THE COUNTER MEDICATION Aller-tec   chlorpheniramine (ALLERGY RELIEF) 4 MG tablet 1 tablet as needed (Patient not taking: Reported on 11/26/2023)   docusate sodium (COLACE) 100 MG capsule 1 tablet (Patient not taking: Reported on 11/26/2023)   estrogen-methylTESTOSTERone 0.625-1.25 MG  tablet 1 tablet (Patient not taking: Reported on 11/26/2023)   Multiple Vitamin (MULTIVITAMIN) capsule Take 1 capsule by mouth daily. (Patient not taking: Reported on 11/26/2023)   Noni, Morinda citrifolia, (NONI PO) 1 capsule   oxyCODONE-acetaminophen (PERCOCET) 5-325 MG tablet Take 1 tablet by mouth every 4 (four) hours as needed for severe pain. (Patient not taking: Reported on 11/26/2023)   oxyCODONE-acetaminophen (PERCOCET) 5-325 MG tablet Take 1 tablet by mouth every 4 (four) hours as needed for severe pain. (Patient not taking: Reported on 11/26/2023)   oxyCODONE-acetaminophen (PERCOCET) 5-325 MG tablet Take 1 tablet by mouth every 4 (four) hours as needed for severe pain. (Patient not taking: Reported on 11/26/2023)   oxyCODONE-acetaminophen (PERCOCET) 5-325 MG tablet Take 1 tablet by mouth every 4 (four) hours as needed for severe pain. (Patient not taking: Reported on 11/26/2023)   UNABLE TO FIND Take by mouth. (Patient not taking: Reported on 11/26/2023)   UNABLE TO FIND Take by mouth. (Patient not taking: Reported on 11/26/2023)   UNABLE TO FIND Omega 3 Fatty Acids-Vitamin E 1000U (Patient not taking: Reported on 11/26/2023)   No facility-administered encounter medications on file as of 11/26/2023.    Past Medical History:  Diagnosis Date   Hypertension    Parotid neoplasm    left    Past Surgical History:  Procedure Laterality Date   ABDOMINAL HYSTERECTOMY     partial   BUNIONECTOMY Right    PAROTIDECTOMY Left 08/29/2015   Procedure: Left neck Wound exploration bleeding, Evacuation of Hematoma;  Surgeon: Flo Shanks, MD;  Location: [facility] OR;  Service: ENT;  Laterality: Left;   PAROTIDECTOMY Left 08/29/2015   Procedure: LEFT  SUPERFICIAL PAROTIDECTOMY;  Surgeon: Flo Shanks, MD;  Location: [facility];  Service: ENT;  Laterality: Left;    History reviewed. No pertinent family history.  Social History   Socioeconomic History   Marital status: Divorced    Spouse  name: Not on file   Number of children: Not on file   Years of education: Not on file   Highest education level: Not on file  Occupational History   Not on file  Tobacco Use   Smoking status: Former    Current packs/day: 0.00    Average packs/day: 0.5 packs/day for 35.0 years (17.5 ttl pk-yrs)    Types: Cigarettes    Start date: 08/22/1980    Quit date: 08/23/2015    Years since quitting: 8.2   Smokeless tobacco: Not on file  Substance and Sexual Activity   Alcohol use: Yes    Comment: social   Drug use: No   Sexual activity: Not Currently    Birth control/protection: Surgical  Other Topics Concern   Not on file  Social History Narrative   Not on file   Social Drivers of Health   Financial Resource Strain: Not on file  Food Insecurity: Low Risk  (10/15/2023)   Received from [facility]   Hunger Vital Sign    Worried About Running Out of Food in the Last Year: Never true    Ran Out of Food in the Last Year: Never true  Transportation Needs: No Transportation Needs (10/15/2023)   Received from [facility]    In the past 12 months, has lack of reliable transportation kept you from medical appointments, meetings, work or from getting things needed for daily living? : No  Physical Activity: Not on file  Stress: Not on file  Social Connections: Not on file  Intimate Partner Violence: Not on file    Review of Systems  Musculoskeletal:  Positive for back pain.  All other systems reviewed and are negative.       Objective    BP 120/80   Pulse 67   Temp 98.2 F (36.8 C) (Oral)   Ht 5\' 1"  (1.549 m)   Wt 146 lb (66.2 kg)   SpO2 92%   BMI 27.59 kg/m   Physical Exam Vitals and nursing note reviewed.  Constitutional:      Appearance: Normal appearance. She is normal weight.  HENT:     Head: Normocephalic and atraumatic.  Cardiovascular:     Rate and Rhythm: Normal rate and regular rhythm.     Pulses: Normal pulses.     Heart sounds: Normal  heart sounds.  Pulmonary:     Effort: Pulmonary effort is normal.     Breath sounds: Normal breath sounds.  Skin:    General: Skin is warm and dry.  Neurological:     General: No focal deficit present.     Mental Status: She is alert and oriented to person, place, and time. Mental status is at baseline.  Psychiatric:        Mood and Affect: Mood normal.        Behavior: Behavior normal.        Thought Content: Thought content normal.        Judgment: Judgment normal.         Assessment & Plan:   Problem List Items Addressed This Visit     Essential hypertension - Primary   Chronic well controlled on amlodipine 5mg  daily and hydrochlorothiazide 25mg  daily.  Recommend heart healthy diet such as Mediterranean diet with whole grains, fruits, vegetable, fish, lean meats, nuts, and olive oil. Limit salt. Encouraged moderate walking, 3-5 times/week for 30-50 minutes each session. Aim for at least 150 minutes.week. Goal should be pace of 3 miles/hours, or walking 1.5 miles in 30 minutes. Avoid tobacco products. Avoid excess alcohol. Take medications as prescribed and bring medications and blood pressure log with cuff to each office visit. Seek medical care for chest pain, palpitations, shortness of breath with exertion, dizziness/lightheadedness, vision changes, recurrent headaches, or swelling of extremities. Follow up in 1 month for fasting labs and AWV then every 6 months if remains controlled      Relevant Orders   CBC with Differential/Platelet   COMPLETE METABOLIC PANEL WITH GFR   Lipid panel   Low back pain   Chronic well controlled on Meloxicam. No radiculopathy.       Pure hypercholesterolemia   Fasting labs ordered. I recommend consuming a heart healthy diet such as Mediterranean diet or DASH diet with whole grains, fruits, vegetable, fish, lean meats, nuts, and olive oil. Limit sweets and processed foods. I also encourage moderate intensity exercise 150 minutes weekly. This is  3-5 times weekly for 30-50 minutes each session. Goal should be pace of 3 miles/hours, or walking 1.5 miles in 30 minutes.      Relevant Orders   CBC with Differential/Platelet   COMPLETE METABOLIC PANEL WITH GFR   Lipid panel   Encounter to establish care with new doctor   Today we reviewed your medical history, current concerns, and health maintenance items. Please schedule your mammogram for next month. Your next colonoscopy will be in 2029. You declined TDap, flu, covid, RSV, PNA vaccines. Continue to abstain from smoking, drug use, or excessive alcohol consumption and maintain your healthy diet and exercise routine. Return to my office in 1 month for fasting labs and AWV and we will perform your annual physical each September.        Return in about 4 weeks (around 12/24/2023) for AWV and chronic follow-up with labs 1 week prior.   Park Meo, FNP

## 2023-11-26 NOTE — Assessment & Plan Note (Signed)
Chronic well controlled on amlodipine 5mg  daily and hydrochlorothiazide 25mg  daily. Recommend heart healthy diet such as Mediterranean diet with whole grains, fruits, vegetable, fish, lean meats, nuts, and olive oil. Limit salt. Encouraged moderate walking, 3-5 times/week for 30-50 minutes each session. Aim for at least 150 minutes.week. Goal should be pace of 3 miles/hours, or walking 1.5 miles in 30 minutes. Avoid tobacco products. Avoid excess alcohol. Take medications as prescribed and bring medications and blood pressure log with cuff to each office visit. Seek medical care for chest pain, palpitations, shortness of breath with exertion, dizziness/lightheadedness, vision changes, recurrent headaches, or swelling of extremities. Follow up in 1 month for fasting labs and AWV then every 6 months if remains controlled

## 2023-11-26 NOTE — Patient Instructions (Signed)
It was great to meet you today and I'm excited to have you join the [facility] practice. I hope you had a positive experience today! If you feel so inclined, please feel free to recommend our practice to friends and family. Kurtis Bushman, FNP-C

## 2023-11-26 NOTE — Assessment & Plan Note (Signed)
Chronic well controlled on Meloxicam. No radiculopathy.

## 2023-11-26 NOTE — Assessment & Plan Note (Signed)
Fasting labs ordered. I recommend consuming a heart healthy diet such as Mediterranean diet or DASH diet with whole grains, fruits, vegetable, fish, lean meats, nuts, and olive oil. Limit sweets and processed foods. I also encourage moderate intensity exercise 150 minutes weekly. This is 3-5 times weekly for 30-50 minutes each session. Goal should be pace of 3 miles/hours, or walking 1.5 miles in 30 minutes.

## 2023-11-26 NOTE — Assessment & Plan Note (Signed)
Today we reviewed your medical history, current concerns, and health maintenance items. Please schedule your mammogram for next month. Your next colonoscopy will be in 2029. You declined TDap, flu, covid, RSV, PNA vaccines. Continue to abstain from smoking, drug use, or excessive alcohol consumption and maintain your healthy diet and exercise routine. Return to my office in 1 month for fasting labs and AWV and we will perform your annual physical each September.

## 2023-12-02 ENCOUNTER — Other Ambulatory Visit: Payer: Self-pay | Admitting: Family Medicine

## 2023-12-02 NOTE — Telephone Encounter (Signed)
 Pls see CRM msg and advise?

## 2023-12-02 NOTE — Telephone Encounter (Signed)
 Copied from CRM [id number]. Topic: Clinical - Medication Refill >> Dec 02, 2023  8:09 AM Phill Myron wrote: Most Recent Primary Care Visit:  Provider: Park Meo  Department: [facility]  Visit Type: NEW PT - OFFICE VISIT  Date: 11/26/2023  Medication: meloxicam (MOBIC) 15 MG tablet   Has the patient contacted their pharmacy? No (Agent: If no, request that the patient contact the pharmacy for the refill. If patient does not wish to contact the pharmacy document the reason why and proceed with request.) (Agent: If yes, when and what did the pharmacy advise?)  Is this the correct pharmacy for this prescription? Yes If no, delete pharmacy and type the correct one.  This is the patient's preferred pharmacy:  [facility] [phone] - [place], [place] - [address] AT [facility] OF [address] & [address]  [place] [address] Phone: [phone] Fax: [phone]     Is the patient out of the medication? No  Has the patient been seen for an appointment in the last year OR does the patient have an upcoming appointment? Yes  Can we respond through MyChart? No  Agent: Please be advised that Rx refills may take up to 3 business days. We ask that you follow-up with your pharmacy.

## 2023-12-05 ENCOUNTER — Telehealth: Payer: Self-pay | Admitting: Family Medicine

## 2023-12-05 NOTE — Telephone Encounter (Signed)
 Patient sent a message via MyChart to request a MyChart video visit for her upcoming appointment on 12/10/23 to discuss meds and get refills.  Patient also requested for Meloxicam to be filled by tomorrow if possible.  Pharmacy confirmed as   [facility] [address] [place], [place] [address] Ph: [phone]  Patient also requesting for her pharmacy to be permanently changed to [facility] on [address], and for [facility] to be removed as an alternate pharmacy.    Please advise patient at [phone].

## 2023-12-06 ENCOUNTER — Other Ambulatory Visit: Payer: Medicare HMO

## 2023-12-06 DIAGNOSIS — I10 Essential (primary) hypertension: Secondary | ICD-10-CM | POA: Diagnosis not present

## 2023-12-06 DIAGNOSIS — E7800 Pure hypercholesterolemia, unspecified: Secondary | ICD-10-CM

## 2023-12-07 LAB — CBC WITH DIFFERENTIAL/PLATELET
Absolute Lymphocytes: 1350 {cells}/uL (ref 850–3900)
Absolute Monocytes: 281 {cells}/uL (ref 200–950)
Basophils Absolute: 32 {cells}/uL (ref 0–200)
Basophils Relative: 0.6 %
Eosinophils Absolute: 567 {cells}/uL — ABNORMAL HIGH (ref 15–500)
Eosinophils Relative: 10.5 %
HCT: 41 % (ref 35.0–45.0)
Hemoglobin: 13.3 g/dL (ref 11.7–15.5)
MCH: 29.4 pg (ref 27.0–33.0)
MCHC: 32.4 g/dL (ref 32.0–36.0)
MCV: 90.7 fL (ref 80.0–100.0)
MPV: 10.3 fL (ref 7.5–12.5)
Monocytes Relative: 5.2 %
Neutro Abs: 3170 {cells}/uL (ref 1500–7800)
Neutrophils Relative %: 58.7 %
Platelets: 320 10*3/uL (ref 140–400)
RBC: 4.52 10*6/uL (ref 3.80–5.10)
RDW: 12.6 % (ref 11.0–15.0)
Total Lymphocyte: 25 %
WBC: 5.4 10*3/uL (ref 3.8–10.8)

## 2023-12-07 LAB — COMPLETE METABOLIC PANEL WITH GFR
AG Ratio: 1.6 (calc) (ref 1.0–2.5)
ALT: 20 U/L (ref 6–29)
AST: 10 U/L (ref 10–35)
Albumin: 4.3 g/dL (ref 3.6–5.1)
Alkaline phosphatase (APISO): 99 U/L (ref 37–153)
BUN: 15 mg/dL (ref 7–25)
CO2: 28 mmol/L (ref 20–32)
Calcium: 9.7 mg/dL (ref 8.6–10.4)
Chloride: 107 mmol/L (ref 98–110)
Creat: 0.58 mg/dL (ref 0.50–1.05)
Globulin: 2.7 g/dL (ref 1.9–3.7)
Glucose, Bld: 83 mg/dL (ref 65–99)
Potassium: 3.8 mmol/L (ref 3.5–5.3)
Sodium: 144 mmol/L (ref 135–146)
Total Bilirubin: 0.4 mg/dL (ref 0.2–1.2)
Total Protein: 7 g/dL (ref 6.1–8.1)
eGFR: 101 mL/min/{1.73_m2} (ref >=60)

## 2023-12-07 LAB — LIPID PANEL
Cholesterol: 230 mg/dL — ABNORMAL HIGH (ref <200)
HDL: 47 mg/dL — ABNORMAL LOW (ref >=50)
LDL Cholesterol (Calc): 157 mg/dL — ABNORMAL HIGH
Non-HDL Cholesterol (Calc): 183 mg/dL — ABNORMAL HIGH (ref <130)
Total CHOL/HDL Ratio: 4.9 (calc) (ref <5.0)
Triglycerides: 140 mg/dL (ref <150)

## 2023-12-11 ENCOUNTER — Ambulatory Visit (INDEPENDENT_AMBULATORY_CARE_PROVIDER_SITE_OTHER): Payer: Medicare HMO | Admitting: Family Medicine

## 2023-12-11 ENCOUNTER — Telehealth: Payer: Self-pay

## 2023-12-11 ENCOUNTER — Encounter: Payer: Self-pay | Admitting: Family Medicine

## 2023-12-11 VITALS — BP 115/72 | HR 67 | Temp 97.8°F | Ht 61.0 in | Wt 144.0 lb

## 2023-12-11 DIAGNOSIS — E782 Mixed hyperlipidemia: Secondary | ICD-10-CM | POA: Diagnosis not present

## 2023-12-11 MED ORDER — MELOXICAM 7.5 MG PO TABS: 7.5000 mg | ORAL_TABLET | Freq: Every day | ORAL | 0 refills | Status: DC

## 2023-12-11 NOTE — Telephone Encounter (Signed)
 Copied from CRM [id number]. Topic: Medical Record Request - Patient Chart Correction Request >> Dec 11, 2023  1:36 PM Ivette P wrote: Reason for CRM: Pt called in to provide information of her   last colonoscopy appt  - 07/12/2023 [facility].   Shingles Vaccines - 03/11/2021 , 08/09/2021 - [facility] [place] [phone])

## 2023-12-11 NOTE — Progress Notes (Signed)
 Subjective:  HPI: [name] [name] is a 63 y.o. female presenting on 12/11/2023 for No chief complaint on file.   HPI Patient is in today for discussion of fasting labs and requests for Meloxicam refill for her back pain. She has been out of this since Friday and also has worsening plantar fasciitis. She is OK with trying 7.5mg  daily, has been on this for a long time. Discussed risks. She has returned to the gym 3 days weekly and doing elliptical work. She is also eating a heart healthy diet. Discussed elevated cholesterol and risk factors.   The 10-year ASCVD risk score (Arnett DK, et al., 2019) is: 6.3%   Values used to calculate the score:     Age: 45 years     Sex: Female     Is Non-Hispanic African American: No     Diabetic: No     Tobacco smoker: No     Systolic Blood Pressure: 115 mmHg     Is BP treated: Yes     HDL Cholesterol: 47 mg/dL     Total Cholesterol: 230 mg/dL   Review of Systems  All other systems reviewed and are negative.   Relevant past medical history reviewed and updated as indicated.   Past Medical History:  Diagnosis Date   Hypertension    Parotid neoplasm    left     Past Surgical History:  Procedure Laterality Date   ABDOMINAL HYSTERECTOMY     partial   BUNIONECTOMY Right    PAROTIDECTOMY Left 08/29/2015   Procedure: Left neck Wound exploration bleeding, Evacuation of Hematoma;  Surgeon: Flo Shanks, MD;  [facility] OR;  Service: ENT;  Laterality: Left;   PAROTIDECTOMY Left 08/29/2015   Procedure: LEFT SUPERFICIAL PAROTIDECTOMY;  Surgeon: Flo Shanks, MD;  Location: [facility];  Service: ENT;  Laterality: Left;    Allergies and medications reviewed and updated.   Current Outpatient Medications:    meloxicam (MOBIC) 7.5 MG tablet, Take 1 tablet (7.5 mg total) by mouth daily., Disp: 30 tablet, Rfl: 0   amLODipine (NORVASC) 5 MG tablet, 1 tablet, Disp: , Rfl:    ELDERBERRY PO, , Disp: , Rfl:    hydrochlorothiazide  (HYDRODIURIL) 25 MG tablet, 1 tablet in the morning, Disp: , Rfl:    Multiple Vitamin (MULTIVITAMIN ADULT PO), 1 tablet, Disp: , Rfl:    Noni, Morinda citrifolia, (NONI PO), 1 capsule, Disp: , Rfl:    Omega-3 1000 MG CAPS, Take by mouth., Disp: , Rfl:    OVER THE COUNTER MEDICATION, Aller-tec, Disp: , Rfl:   Allergy: PCN   Objective:   BP 115/72   Pulse 67   Temp 97.8 F (36.6 C) (Oral)   Ht 5\' 1"  (1.549 m)   Wt 144 lb (65.3 kg)   SpO2 98%   BMI 27.21 kg/m      12/11/2023   11:27 AM 11/26/2023    8:06 AM 04/13/2022    4:41 PM  Vitals with BMI  Height 5\' 1"  5\' 1"    Weight 144 lbs 146 lbs   BMI 27.22 27.6   Systolic 115 120 478  Diastolic 72 80 104  Pulse 67 67 101     Physical Exam Vitals and nursing note reviewed.  Constitutional:      Appearance: Normal appearance. She is normal weight.  HENT:     Head: Normocephalic and atraumatic.  Cardiovascular:     Rate and Rhythm: Normal rate and regular rhythm.  Pulses: Normal pulses.     Heart sounds: Normal heart sounds.  Pulmonary:     Effort: Pulmonary effort is normal.     Breath sounds: Normal breath sounds.  Skin:    General: Skin is warm and dry.  Neurological:     General: No focal deficit present.     Mental Status: She is alert and oriented to person, place, and time. Mental status is at baseline.  Psychiatric:        Mood and Affect: Mood normal.        Behavior: Behavior normal.        Thought Content: Thought content normal.        Judgment: Judgment normal.     Assessment & Plan:  Moderate mixed hyperlipidemia not requiring statin therapy Assessment & Plan: Labs showed elevated cholesterol. I recommend consuming a heart healthy diet such as Mediterranean diet or DASH diet with whole grains, fruits, vegetable, fish, lean meats, nuts, and olive oil. Limit sweets and processed foods. I also encourage moderate intensity exercise 150 minutes weekly. This is 3-5 times weekly for 30-50 minutes each  session. Goal should be pace of 3 miles/hours, or walking 1.5 miles in 30 minutes. The 10-year ASCVD risk score (Arnett DK, et al., 2019) is: 6.3%    Other orders -     Meloxicam; Take 1 tablet (7.5 mg total) by mouth daily.  Dispense: 30 tablet; Refill: 0     Follow up plan: Return in about 6 months (around 06/09/2024) for chronic follow-up with labs 1 week prior, AWV ASAP.  Park Meo, FNP

## 2023-12-11 NOTE — Assessment & Plan Note (Signed)
 Labs showed elevated cholesterol. I recommend consuming a heart healthy diet such as Mediterranean diet or DASH diet with whole grains, fruits, vegetable, fish, lean meats, nuts, and olive oil. Limit sweets and processed foods. I also encourage moderate intensity exercise 150 minutes weekly. This is 3-5 times weekly for 30-50 minutes each session. Goal should be pace of 3 miles/hours, or walking 1.5 miles in 30 minutes. The 10-year ASCVD risk score (Arnett DK, et al., 2019) is: 6.3%

## 2023-12-12 NOTE — Telephone Encounter (Signed)
 Update shingles, will wait for colonoscopy report

## 2023-12-24 ENCOUNTER — Other Ambulatory Visit: Payer: Medicare HMO

## 2023-12-30 ENCOUNTER — Ambulatory Visit: Payer: Self-pay | Admitting: Family Medicine

## 2023-12-30 ENCOUNTER — Other Ambulatory Visit (HOSPITAL_COMMUNITY): Payer: Self-pay | Admitting: Family Medicine

## 2023-12-30 DIAGNOSIS — Z1231 Encounter for screening mammogram for malignant neoplasm of breast: Secondary | ICD-10-CM

## 2023-12-30 NOTE — Telephone Encounter (Signed)
  Chief Complaint: medication question Symptoms: 5-6/10 back pain toward the end of the day  Disposition: [] ED /[] Urgent Care (no appt availability in office) / [] Appointment(In office/virtual)/ []  [facility]/ [] Home Care/ [] Refused Recommended Disposition /[] [facility]/ [x]  Follow-up with PCP Additional Notes: Patient states she was previously on 15mg  dosage of meloxicam. She states she has been on the 7.5mg  since 12/11/23 OV. She states it has helped the pain some, but states by the end of the day her back pain becomes moderate. She is asking if she can increase her dosage back up to 15mg . She states she has been taking "500mg  anti histamine over the counter to help with pain since I can't take ibuprofen."  Copied from CRM [id number]. Topic: Clinical - Prescription Issue >> Dec 30, 2023  1:14 PM Ja-Kwan M wrote: Reason for CRM: Patient stated that the meloxicam (MOBIC) 7.5 MG tablet is not working. Patient stated that she was told to call back to advise if the Rx needed to be adjusted. Patient would like to increase the Rx dosage to 15 MG as it was originally. Reason for Disposition  [1] Caller has NON-URGENT medicine question about med that PCP prescribed AND [2] triager unable to answer question  Answer Assessment - Initial Assessment Questions 1. NAME of MEDICINE: "What medicine(s) are you calling about?"     Meloxicam.  2. QUESTION: "What is your question?" (e.g., double dose of medicine, side effect)     Patient states at her previous PCP ([facility]) she was on meloxicam 15mg . She states she was placed on 7.5mg  at last office visit on 12/11/23 and would like to increase to the 15mg  dose.  3. PRESCRIBER: "Who prescribed the medicine?" Reason: if prescribed by specialist, call should be referred to that group.     Kurtis Bushman FNP.  4. SYMPTOMS: "Do you have any symptoms?" If Yes, ask: "What symptoms are you having?"  "How bad are the symptoms (e.g., mild, moderate,  severe)     Back pain has improved some on the Meloxicam, she states later in the day her back starts to hurt. 5-6/10 back pain.  5. PREGNANCY:  "Is there any chance that you are pregnant?" "When was your last menstrual period?"     N/A.  Protocols used: Medication Question Call-A-AH

## 2024-01-02 ENCOUNTER — Other Ambulatory Visit: Payer: Self-pay

## 2024-01-02 MED ORDER — MELOXICAM 15 MG PO TABS: 15.0000 mg | ORAL_TABLET | Freq: Every day | ORAL | 0 refills | Status: DC

## 2024-01-03 ENCOUNTER — Ambulatory Visit (HOSPITAL_COMMUNITY)
Admission: RE | Admit: 2024-01-03 | Discharge: 2024-01-03 | Disposition: A | Discharge status: Home / Self Care | Source: Ambulatory Visit | Attending: Family Medicine | Admitting: Family Medicine

## 2024-01-03 DIAGNOSIS — Z1231 Encounter for screening mammogram for malignant neoplasm of breast: Secondary | ICD-10-CM | POA: Diagnosis not present

## 2024-01-06 ENCOUNTER — Other Ambulatory Visit: Payer: Self-pay

## 2024-01-06 MED ORDER — AMLODIPINE BESYLATE 5 MG PO TABS: 5.0000 mg | ORAL_TABLET | Freq: Every day | ORAL | 0 refills | Status: DC

## 2024-01-06 MED ORDER — HYDROCHLOROTHIAZIDE 25 MG PO TABS: 25.0000 mg | ORAL_TABLET | Freq: Every day | ORAL | 0 refills | Status: DC

## 2024-01-07 ENCOUNTER — Other Ambulatory Visit: Payer: Self-pay | Admitting: Family Medicine

## 2024-03-15 ENCOUNTER — Other Ambulatory Visit: Payer: Self-pay | Admitting: Family Medicine

## 2024-03-30 ENCOUNTER — Other Ambulatory Visit: Payer: Self-pay | Admitting: Family Medicine

## 2024-04-30 ENCOUNTER — Other Ambulatory Visit: Payer: Self-pay | Admitting: Family Medicine

## 2024-06-09 ENCOUNTER — Other Ambulatory Visit: Payer: Self-pay | Admitting: Family Medicine

## 2024-07-02 ENCOUNTER — Ambulatory Visit: Payer: Medicare HMO | Admitting: *Deleted

## 2024-07-02 VITALS — Ht 60.0 in | Wt 144.0 lb

## 2024-07-02 DIAGNOSIS — Z0000 Encounter for general adult medical examination without abnormal findings: Secondary | ICD-10-CM | POA: Diagnosis not present

## 2024-07-02 NOTE — Patient Instructions (Signed)
 [name] , Thank you for taking time to come for your Medicare Wellness Visit. I appreciate your ongoing commitment to your health goals. Please review the following plan we discussed and let me know if I can assist you in the future.   Screening recommendations/referrals: Colonoscopy: education provided Mammogram: up to date Bone Density: Education provided Recommended yearly ophthalmology/optometry visit for glaucoma screening and checkup Recommended yearly dental visit for hygiene and checkup  Vaccinations: Influenza vaccine: Education provided Pneumococcal vaccine: up to date Tdap vaccine: education provided Shingles vaccine: up to date      Preventive Care 65 Years and Older, Female Preventive care refers to lifestyle choices and visits with your health care provider that can promote health and wellness. What does preventive care include? A yearly physical exam. This is also called an annual well check. Dental exams once or twice a year. Routine eye exams. Ask your health care provider how often you should have your eyes checked. Personal lifestyle choices, including: Daily care of your teeth and gums. Regular physical activity. Eating a healthy diet. Avoiding tobacco and drug use. Limiting alcohol use. Practicing safe sex. Taking low-dose aspirin every day. Taking vitamin and mineral supplements as recommended by your health care provider. What happens during an annual well check? The services and screenings done by your health care provider during your annual well check will depend on your age, overall health, lifestyle risk factors, and family history of disease. Counseling  Your health care provider may ask you questions about your: Alcohol use. Tobacco use. Drug use. Emotional well-being. Home and relationship well-being. Sexual activity. Eating habits. History of falls. Memory and ability to understand (cognition). Work and work Astronomer. Reproductive  health. Screening  You may have the following tests or measurements: Height, weight, and BMI. Blood pressure. Lipid and cholesterol levels. These may be checked every 5 years, or more frequently if you are over 12 years old. Skin check. Lung cancer screening. You may have this screening every year starting at age 54 if you have a 30-pack-year history of smoking and currently smoke or have quit within the past 15 years. Fecal occult blood test (FOBT) of the stool. You may have this test every year starting at age 37. Flexible sigmoidoscopy or colonoscopy. You may have a sigmoidoscopy every 5 years or a colonoscopy every 10 years starting at age 34. Hepatitis C blood test. Hepatitis B blood test. Sexually transmitted disease (STD) testing. Diabetes screening. This is done by checking your blood sugar (glucose) after you have not eaten for a while (fasting). You may have this done every 1-3 years. Bone density scan. This is done to screen for osteoporosis. You may have this done starting at age 36. Mammogram. This may be done every 1-2 years. Talk to your health care provider about how often you should have regular mammograms. Talk with your health care provider about your test results, treatment options, and if necessary, the need for more tests. Vaccines  Your health care provider may recommend certain vaccines, such as: Influenza vaccine. This is recommended every year. Tetanus, diphtheria, and acellular pertussis (Tdap, Td) vaccine. You may need a Td booster every 10 years. Zoster vaccine. You may need this after age 73. Pneumococcal 13-valent conjugate (PCV13) vaccine. One dose is recommended after age 37. Pneumococcal polysaccharide (PPSV23) vaccine. One dose is recommended after age 25. Talk to your health care provider about which screenings and vaccines you need and how often you need them. This information is not intended  to replace advice given to you by your health care provider.  Make sure you discuss any questions you have with your health care provider. Document Released: 10/28/2015 Document Revised: 06/20/2016 Document Reviewed: 08/02/2015 Elsevier Interactive Patient Education  2017 [employer].  Fall Prevention in the Home Falls can cause injuries. They can happen to people of all ages. There are many things you can do to make your home safe and to help prevent falls. What can I do on the outside of my home? Regularly fix the edges of walkways and driveways and fix any cracks. Remove anything that might make you trip as you walk through a door, such as a raised step or threshold. Trim any bushes or trees on the path to your home. Use bright outdoor lighting. Clear any walking paths of anything that might make someone trip, such as rocks or tools. Regularly check to see if handrails are loose or broken. Make sure that both sides of any steps have handrails. Any raised decks and porches should have guardrails on the edges. Have any leaves, snow, or ice cleared regularly. Use sand or salt on walking paths during winter. Clean up any spills in your garage right away. This includes oil or grease spills. What can I do in the bathroom? Use night lights. Install grab bars by the toilet and in the tub and shower. Do not use towel bars as grab bars. Use non-skid mats or decals in the tub or shower. If you need to sit down in the shower, use a plastic, non-slip stool. Keep the floor dry. Clean up any water that spills on the floor as soon as it happens. Remove soap buildup in the tub or shower regularly. Attach bath mats securely with double-sided non-slip rug tape. Do not have throw rugs and other things on the floor that can make you trip. What can I do in the bedroom? Use night lights. Make sure that you have a light by your bed that is easy to reach. Do not use any sheets or blankets that are too big for your bed. They should not hang down onto the floor. Have a  firm chair that has side arms. You can use this for support while you get dressed. Do not have throw rugs and other things on the floor that can make you trip. What can I do in the kitchen? Clean up any spills right away. Avoid walking on wet floors. Keep items that you use a lot in easy-to-reach places. If you need to reach something above you, use a strong step stool that has a grab bar. Keep electrical cords out of the way. Do not use floor polish or wax that makes floors slippery. If you must use wax, use non-skid floor wax. Do not have throw rugs and other things on the floor that can make you trip. What can I do with my stairs? Do not leave any items on the stairs. Make sure that there are handrails on both sides of the stairs and use them. Fix handrails that are broken or loose. Make sure that handrails are as long as the stairways. Check any carpeting to make sure that it is firmly attached to the stairs. Fix any carpet that is loose or worn. Avoid having throw rugs at the top or bottom of the stairs. If you do have throw rugs, attach them to the floor with carpet tape. Make sure that you have a light switch at the top of the stairs and  the bottom of the stairs. If you do not have them, ask someone to add them for you. What else can I do to help prevent falls? Wear shoes that: Do not have high heels. Have rubber bottoms. Are comfortable and fit you well. Are closed at the toe. Do not wear sandals. If you use a stepladder: Make sure that it is fully opened. Do not climb a closed stepladder. Make sure that both sides of the stepladder are locked into place. Ask someone to hold it for you, if possible. Clearly mark and make sure that you can see: Any grab bars or handrails. First and last steps. Where the edge of each step is. Use tools that help you move around (mobility aids) if they are needed. These include: Canes. Walkers. Scooters. Crutches. Turn on the lights when you  go into a dark area. Replace any light bulbs as soon as they burn out. Set up your furniture so you have a clear path. Avoid moving your furniture around. If any of your floors are uneven, fix them. If there are any pets around you, be aware of where they are. Review your medicines with your doctor. Some medicines can make you feel dizzy. This can increase your chance of falling. Ask your doctor what other things that you can do to help prevent falls. This information is not intended to replace advice given to you by your health care provider. Make sure you discuss any questions you have with your health care provider. Document Released: 07/28/2009 Document Revised: 03/08/2016 Document Reviewed: 11/05/2014 Elsevier Interactive Patient Education  2017 [employer].

## 2024-07-02 NOTE — Progress Notes (Signed)
 Subjective:   [name] [name] is a 63 y.o. female who presents for Medicare Annual (Subsequent) preventive examination.  Visit Complete: Virtual I connected with  Niels CHRISTELLA Lily on 07/02/24 by a video and audio enabled telemedicine application and verified that I am speaking with the correct person using two identifiers.  Patient Location: Home  Provider Location: Home Office  I discussed the limitations of evaluation and management by telemedicine. The patient expressed understanding and agreed to proceed.  Vital Signs: Because this visit was a virtual/telehealth visit, some criteria may be missing or patient reported. Any vitals not documented were not able to be obtained and vitals that have been documented are patient reported.  Cardiac Risk Factors include: advanced age (>11men, >63 women);hypertension     Objective:    Today's Vitals   07/02/24 1610  Weight: 144 lb (65.3 kg)  Height: 5' (1.524 m)   Body mass index is 28.12 kg/m.     07/02/2024    4:06 PM 04/13/2022    4:41 PM 08/29/2015   10:01 PM 08/23/2015   11:57 AM  Advanced Directives  Does Patient Have a Medical Advance Directive? No No No  No   Would patient like information on creating a medical advance directive? No - Patient declined  No - patient declined information       Data saved with a previous flowsheet row definition    Current Medications (verified) Outpatient Encounter Medications as of 07/02/2024  Medication Sig   amLODipine  (NORVASC ) 5 MG tablet TAKE 1 TABLET(5 MG) BY MOUTH DAILY   ELDERBERRY PO    hydrochlorothiazide  (HYDRODIURIL ) 25 MG tablet TAKE 1 TABLET(25 MG) BY MOUTH DAILY   meloxicam  (MOBIC ) 15 MG tablet TAKE 1 TABLET(15 MG) BY MOUTH DAILY   Multiple Vitamin (MULTIVITAMIN ADULT PO) 1 tablet   Noni, Morinda citrifolia, (NONI PO) 1 capsule   Omega-3 1000 MG CAPS Take by mouth.   OVER THE COUNTER MEDICATION Aller-tec   [DISCONTINUED] amLODipine  (NORVASC ) 5 MG tablet 1 tablet    [DISCONTINUED] chlorpheniramine (ALLERGY RELIEF) 4 MG tablet 1 tablet as needed (Patient not taking: Reported on 11/26/2023)   [DISCONTINUED] docusate sodium (COLACE) 100 MG capsule 1 tablet (Patient not taking: Reported on 11/26/2023)   [DISCONTINUED] estrogen-methylTESTOSTERone 0.625-1.25 MG tablet 1 tablet (Patient not taking: Reported on 11/26/2023)   [DISCONTINUED] hydrochlorothiazide  (HYDRODIURIL ) 25 MG tablet 1 tablet in the morning   [DISCONTINUED] meloxicam  (MOBIC ) 15 MG tablet 1 tablet   [DISCONTINUED] Multiple Vitamin (MULTIVITAMIN) capsule Take 1 capsule by mouth daily. (Patient not taking: Reported on 11/26/2023)   [DISCONTINUED] oxyCODONE -acetaminophen  (PERCOCET) 5-325 MG tablet Take 1 tablet by mouth every 4 (four) hours as needed for severe pain. (Patient not taking: Reported on 11/26/2023)   [DISCONTINUED] oxyCODONE -acetaminophen  (PERCOCET) 5-325 MG tablet Take 1 tablet by mouth every 4 (four) hours as needed for severe pain. (Patient not taking: Reported on 11/26/2023)   [DISCONTINUED] oxyCODONE -acetaminophen  (PERCOCET) 5-325 MG tablet Take 1 tablet by mouth every 4 (four) hours as needed for severe pain. (Patient not taking: Reported on 11/26/2023)   [DISCONTINUED] oxyCODONE -acetaminophen  (PERCOCET) 5-325 MG tablet Take 1 tablet by mouth every 4 (four) hours as needed for severe pain. (Patient not taking: Reported on 11/26/2023)   [DISCONTINUED] UNABLE TO FIND Take by mouth. (Patient not taking: Reported on 11/26/2023)   [DISCONTINUED] UNABLE TO FIND Take by mouth. (Patient not taking: Reported on 11/26/2023)   [DISCONTINUED] UNABLE TO FIND Omega 3 Fatty Acids-Vitamin E 1000U (Patient not taking: Reported on 11/26/2023)  No facility-administered encounter medications on file as of 07/02/2024.    Allergies (verified) Penicillins   History: Past Medical History:  Diagnosis Date   Hypertension    Parotid neoplasm    left   Past Surgical History:  Procedure Laterality Date    ABDOMINAL HYSTERECTOMY     partial   BUNIONECTOMY Right    PAROTIDECTOMY Left 08/29/2015   Procedure: Left neck Wound exploration bleeding, Evacuation of Hematoma;  Surgeon: Marlyce Finer, MD;  Location: [facility] OR;  Service: ENT;  Laterality: Left;   PAROTIDECTOMY Left 08/29/2015   Procedure: LEFT SUPERFICIAL PAROTIDECTOMY;  Surgeon: Marlyce Finer, MD;  Location: [facility];  Service: ENT;  Laterality: Left;   History reviewed. No pertinent family history. Social History   Socioeconomic History   Marital status: Divorced    Spouse name: Not on file   Number of children: Not on file   Years of education: Not on file   Highest education level: Never attended school  Occupational History   Not on file  Tobacco Use   Smoking status: Former    Current packs/day: 0.00    Average packs/day: 0.5 packs/day for 35.0 years (17.5 ttl pk-yrs)    Types: Cigarettes    Start date: 08/22/1980    Quit date: 08/23/2015    Years since quitting: 8.8   Smokeless tobacco: Not on file  Substance and Sexual Activity   Alcohol use: Yes    Comment: social   Drug use: No   Sexual activity: Not Currently    Birth control/protection: Surgical  Other Topics Concern   Not on file  Social History Narrative   Not on file   Social Drivers of Health   Financial Resource Strain: Low Risk  (12/07/2023)   Overall Financial Resource Strain (CARDIA)    Difficulty of Paying Living Expenses: Not hard at all  Food Insecurity: No Food Insecurity (07/02/2024)   Hunger Vital Sign    Worried About Running Out of Food in the Last Year: Never true    Ran Out of Food in the Last Year: Never true  Transportation Needs: No Transportation Needs (12/07/2023)   PRAPARE - Transportation    Lack of Transportation (Medical): No    Lack of Transportation (Non-Medical): No  Physical Activity: Insufficiently Active (07/02/2024)   Exercise Vital Sign    Days of Exercise per Week: 3 days    Minutes of Exercise per  Session: 40 min  Stress: No Stress Concern Present (12/07/2023)   [employer] of Occupational Health - Occupational Stress Questionnaire    Feeling of Stress : Not at all  Social Connections: Socially Isolated (07/02/2024)   Social Connection and Isolation Panel    Frequency of Communication with Friends and Family: More than three times a week    Frequency of Social Gatherings with Friends and Family: More than three times a week    Attends Religious Services: Never    Database administrator or Organizations: No    Attends Banker Meetings: Never    Marital Status: Divorced    Tobacco Counseling Counseling given: Not Answered   Clinical Intake:                        Activities of Daily Living    07/02/2024    4:10 PM  In your present state of health, do you have any difficulty performing the following activities:  Hearing? 0  Vision? 0  Difficulty concentrating  or making decisions? 0  Walking or climbing stairs? 0  Dressing or bathing? 0  Doing errands, shopping? 0  Preparing Food and eating ? N  Using the Toilet? N  In the past six months, have you accidently leaked urine? N  Do you have problems with loss of bowel control? N  Managing your Medications? N  Managing your Finances? N  Housekeeping or managing your Housekeeping? N    Patient Care Team: Kayla Jeoffrey RAMAN, FNP as PCP - General (Family Medicine)  Indicate any recent Medical Services you may have received from other than [facility] providers in the past year (date may be approximate).     Assessment:   This is a routine wellness examination for Korina.  Hearing/Vision screen Hearing Screening - Comments:: No trouble hearing Vision Screening - Comments:: Up to date [facility]    Goals Addressed             This Visit's Progress    Increase physical activity         Depression Screen    07/02/2024    4:12 PM 12/11/2023    3:14 PM 11/26/2023    8:25 AM  PHQ 2/9 Scores   PHQ - 2 Score 0 0 0  PHQ- 9 Score 0 0 0    Fall Risk    07/02/2024    4:05 PM 11/26/2023    8:26 AM  Fall Risk   Falls in the past year? 0 0  Number falls in past yr: 0 0  Injury with Fall? 0 0  Risk for fall due to :  No Fall Risks  Follow up Falls evaluation completed;Education provided;Falls prevention discussed Education provided;Falls prevention discussed    MEDICARE RISK AT HOME: Medicare Risk at Home Any stairs in or around the home?: No If so, are there any without handrails?: No Home free of loose throw rugs in walkways, pet beds, electrical cords, etc?: Yes Adequate lighting in your home to reduce risk of falls?: Yes Life alert?: No Use of a cane, walker or w/c?: No Grab bars in the bathroom?: No Shower chair or bench in shower?: No Elevated toilet seat or a handicapped toilet?: No  TIMED UP AND GO:  Was the test performed?  No    Cognitive Function:        07/02/2024    4:07 PM  6CIT Screen  What Year? 0 points  What month? 0 points  What time? 0 points  Count back from 20 0 points  Months in reverse 0 points  Repeat phrase 0 points  Total Score 0 points    Immunizations Immunization History  Administered Date(s) Administered   Influenza, Quadrivalent, Recombinant, Inj, Pf 07/26/2017   Influenza,inj,Quad PF,6+ Mos 09/14/2016, 08/06/2019   Influenza-Unspecified 08/17/2010, 08/22/2011, 08/04/2012, 07/10/2013, 07/04/2015, 08/06/2019   Tdap 08/17/2010   Zoster Recombinant(Shingrix) 03/11/2021, 08/09/2021   Zoster, Unspecified 03/11/2021    TDAP status: Due, Education has been provided regarding the importance of this vaccine. Advised may receive this vaccine at local pharmacy or Health Dept. Aware to provide a copy of the vaccination record if obtained from local pharmacy or Health Dept. Verbalized acceptance and understanding.  Flu Vaccine status: Due, Education has been provided regarding the importance of this vaccine. Advised may receive this  vaccine at local pharmacy or Health Dept. Aware to provide a copy of the vaccination record if obtained from local pharmacy or Health Dept. Verbalized acceptance and understanding.  Pneumococcal vaccine status: Up to date  Covid-19  vaccine status: Information provided on how to obtain vaccines.   Qualifies for Shingles Vaccine? No   Zostavax completed Yes   Shingrix Completed?: Yes  Screening Tests Health Maintenance  Topic Date Due   Colonoscopy  Never done   Pneumococcal Vaccine: 50+ Years (1 of 1 - PCV) Never done   Influenza Vaccine  05/15/2024   DTaP/Tdap/Td (2 - Td or Tdap) 11/25/2024 (Originally 08/17/2020)   Hepatitis C Screening  11/25/2024 (Originally 09[birth year]10/1977)   HIV Screening  11/25/2024 (Originally 09/10/1974)   Cervical Cancer Screening (HPV/Pap [facility])  12/10/2024 (Originally 09/10/1989)   Medicare Annual Wellness (AWV)  07/02/2025   Mammogram  01/02/2026   Zoster Vaccines- Shingrix  Completed   Hepatitis B Vaccines 19-59 Average Risk  Aged Out   HPV VACCINES  Aged Out   Meningococcal B Vaccine  Aged Out   COVID-19 Vaccine  Discontinued    Health Maintenance  Health Maintenance Due  Topic Date Due   Colonoscopy  Never done   Pneumococcal Vaccine: 50+ Years (1 of 1 - PCV) Never done   Influenza Vaccine  05/15/2024    Colonoscopy   Education provided   Declined at this time  Mammogram status: Completed  . Repeat every year  Bone Density   Declined   Lung Cancer Screening: (Low Dose CT Chest recommended if Age 35-80 years, 20 pack-year currently smoking OR have quit w/in 15years.) does not qualify.   Lung Cancer Screening Referral:   Additional Screening:  Hepatitis C Screening: does not qualify; Completed 2022  Vision Screening: Recommended annual ophthalmology exams for early detection of glaucoma and other disorders of the eye. Is the patient up to date with their annual eye exam?  Yes  Who is the provider or what is the name of the office in which  the patient attends annual eye exams? [facility] If pt is not established with a provider, would they like to be referred to a provider to establish care? No .   Dental Screening: Recommended annual dental exams for proper oral hygiene   Community Resource Referral / Chronic Care Management: CRR required this visit?  No   CCM required this visit?  No     Plan:     I have personally reviewed and noted the following in the patient's chart:   Medical and social history Use of alcohol, tobacco or illicit drugs  Current medications and supplements including opioid prescriptions. Patient is not currently taking opioid prescriptions. Functional ability and status Nutritional status Physical activity Advanced directives List of other physicians Hospitalizations, surgeries, and ER visits in previous 12 months Vitals Screenings to include cognitive, depression, and falls Referrals and appointments  In addition, I have reviewed and discussed with patient certain preventive protocols, quality metrics, and best practice recommendations. A written personalized care plan for preventive services as well as general preventive health recommendations were provided to patient.     Mliss Graff, LPN   [id number]   After Visit Summary: (MyChart) Due to this being a telephonic visit, the after visit summary with patients personalized plan was offered to patient via MyChart   Nurse Notes:

## 2024-07-03 ENCOUNTER — Other Ambulatory Visit: Payer: Self-pay | Admitting: Family Medicine

## 2024-07-14 ENCOUNTER — Telehealth: Payer: Self-pay | Admitting: Family Medicine

## 2024-07-14 NOTE — Telephone Encounter (Signed)
 Prescription Request  07/14/2024  LOV: 12/11/2023  What is the name of the medication or equipment?   amLODipine  (NORVASC ) 5 MG tablet   hydrochlorothiazide  (HYDRODIURIL ) 25 MG tablet   meloxicam  (MOBIC ) 15 MG tablet  **90 day scripts requested**  Have you contacted your pharmacy to request a refill? Yes   Which pharmacy would you like this sent to?  [facility] - [place], [place] - [address] [place] [address] Phone: [phone] Fax: [phone]    Patient notified that their request is being sent to the clinical staff for review and that they should receive a response within 2 business days.   Please advise pharmacist.

## 2024-07-16 ENCOUNTER — Other Ambulatory Visit: Payer: Self-pay

## 2024-07-16 DIAGNOSIS — I10 Essential (primary) hypertension: Secondary | ICD-10-CM

## 2024-07-16 DIAGNOSIS — G8929 Other chronic pain: Secondary | ICD-10-CM

## 2024-07-16 MED ORDER — MELOXICAM 15 MG PO TABS: 15.0000 mg | ORAL_TABLET | Freq: Every day | ORAL | 0 refills | Status: AC

## 2024-07-16 MED ORDER — HYDROCHLOROTHIAZIDE 25 MG PO TABS: 25.0000 mg | ORAL_TABLET | Freq: Every day | ORAL | 0 refills | Status: DC

## 2024-07-16 MED ORDER — AMLODIPINE BESYLATE 5 MG PO TABS: 5.0000 mg | ORAL_TABLET | Freq: Every day | ORAL | 0 refills | Status: DC

## 2024-07-25 ENCOUNTER — Other Ambulatory Visit: Payer: Self-pay | Admitting: Family Medicine

## 2024-07-25 DIAGNOSIS — I10 Essential (primary) hypertension: Secondary | ICD-10-CM

## 2024-07-29 ENCOUNTER — Ambulatory Visit: Admitting: Family Medicine

## 2024-07-29 ENCOUNTER — Encounter: Payer: Self-pay | Admitting: Family Medicine

## 2024-07-29 ENCOUNTER — Ambulatory Visit (INDEPENDENT_AMBULATORY_CARE_PROVIDER_SITE_OTHER): Admitting: Family Medicine

## 2024-07-29 VITALS — BP 124/82 | HR 78 | Temp 98.7°F | Ht 60.0 in | Wt 141.4 lb

## 2024-07-29 DIAGNOSIS — M545 Low back pain, unspecified: Secondary | ICD-10-CM

## 2024-07-29 DIAGNOSIS — Z0001 Encounter for general adult medical examination with abnormal findings: Secondary | ICD-10-CM | POA: Diagnosis not present

## 2024-07-29 DIAGNOSIS — E782 Mixed hyperlipidemia: Secondary | ICD-10-CM | POA: Diagnosis not present

## 2024-07-29 DIAGNOSIS — Z0000 Encounter for general adult medical examination without abnormal findings: Secondary | ICD-10-CM | POA: Insufficient documentation

## 2024-07-29 DIAGNOSIS — G8929 Other chronic pain: Secondary | ICD-10-CM | POA: Diagnosis not present

## 2024-07-29 DIAGNOSIS — I10 Essential (primary) hypertension: Secondary | ICD-10-CM | POA: Diagnosis not present

## 2024-07-29 NOTE — Assessment & Plan Note (Signed)
 Fasting labs today. I recommend consuming a heart healthy diet such as Mediterranean diet or DASH diet with whole grains, fruits, vegetable, fish, lean meats, nuts, and olive oil. Limit sweets and processed foods. I also encourage moderate intensity exercise 150 minutes weekly. This is 3-5 times weekly for 30-50 minutes each session. Goal should be pace of 3 miles/hours, or walking 1.5 miles in 30 minutes. The 10-year ASCVD risk score (Arnett DK, et al., 2019) is: 7.3%

## 2024-07-29 NOTE — Progress Notes (Signed)
 Subjective:  HPI: [name] [name] is a 63 y.o. female presenting on 07/29/2024 for Medical Management of Chronic Issues   HPI Patient is in today for medication refills and labs, is due for CPE.  PMH includes HTN, HLD, and DDD.  HTN: on Amlodipine  5mg  daily, hydrochlorothiazide  25mg  daily Denies chest pain, palpitations, recurrent headaches, vision changes, lightheadedness, dizziness, dyspnea on exertion, or swelling of extremities.  HLD: on Omega 3 1000mg  daily, declines statin therapy Lipid Panel     Component Value Date/Time   CHOL 230 (H) 12/06/2023 0803   TRIG 140 12/06/2023 0803   HDL 47 (L) 12/06/2023 0803   CHOLHDL 4.9 12/06/2023 0803   LDLCALC 157 (H) 12/06/2023 0803  The 10-year ASCVD risk score (Arnett DK, et al., 2019) is: 7.3%  DDD: associated with chronic low back pain, on Meloxicam  15mg  daily   [name] [name] had a colonoscopy 1-2 years ago with [name] who recommended 5 year surveillance. Has had a partial hysterectomy with ovaries remaining. Mammogram UTD. Declines vaccines.  Health Maintenance  Topic Date Due   Colonoscopy  Never done   Pneumococcal Vaccine: 50+ Years (1 of 1 - PCV) Never done   DTaP/Tdap/Td (2 - Td or Tdap) 11/25/2024 (Originally 08/17/2020)   Hepatitis C Screening  11/25/2024 (Originally 09[birth year]10/1977)   HIV Screening  11/25/2024 (Originally 09/10/1974)   Influenza Vaccine  01/12/2025 (Originally 05/15/2024)   Medicare Annual Wellness (AWV)  07/02/2025   Mammogram  01/02/2026   Zoster Vaccines- Shingrix  Completed   Hepatitis B Vaccines 19-59 Average Risk  Aged Out   HPV VACCINES  Aged Out   Meningococcal B Vaccine  Aged Out   COVID-19 Vaccine  Discontinued     Review of Systems  Constitutional: Negative.   HENT:  Positive for ear discharge.   Eyes: Negative.   Respiratory: Negative.    Cardiovascular: Negative.   Gastrointestinal: Negative.   Genitourinary: Negative.   Musculoskeletal: Negative.   Skin: Negative.   Neurological:  Negative.   Endo/Heme/Allergies: Negative.   Psychiatric/Behavioral: Negative.    All other systems reviewed and are negative.   Relevant past medical history reviewed and updated as indicated.   Past Medical History:  Diagnosis Date   Hyperlipidemia    Hypertension    Parotid neoplasm    left     Past Surgical History:  Procedure Laterality Date   ABDOMINAL HYSTERECTOMY     partial   BUNIONECTOMY Right    PAROTIDECTOMY Left 08/29/2015   Procedure: Left neck Wound exploration bleeding, Evacuation of Hematoma;  Surgeon: [name] Finer, MD;  Location: [facility] OR;  Service: ENT;  Laterality: Left;   PAROTIDECTOMY Left 08/29/2015   Procedure: LEFT SUPERFICIAL PAROTIDECTOMY;  Surgeon: [name] Finer, MD;  Location: [facility];  Service: ENT;  Laterality: Left;    Allergies and medications reviewed and updated.   Current Outpatient Medications:    amLODipine  (NORVASC ) 5 MG tablet, Take 1 tablet (5 mg total) by mouth daily., Disp: 90 tablet, Rfl: 0   ELDERBERRY PO, , Disp: , Rfl:    hydrochlorothiazide  (HYDRODIURIL ) 25 MG tablet, Take 1 tablet (25 mg total) by mouth daily., Disp: 90 tablet, Rfl: 0   meloxicam  (MOBIC ) 15 MG tablet, Take 1 tablet (15 mg total) by mouth daily., Disp: 90 tablet, Rfl: 0   Multiple Vitamin (MULTIVITAMIN ADULT PO), 1 tablet, Disp: , Rfl:    Noni, Morinda citrifolia, (NONI PO), 1 capsule, Disp: , Rfl:    Omega-3 1000 MG CAPS, Take  by mouth., Disp: , Rfl:    OVER THE COUNTER MEDICATION, Aller-tec, Disp: , Rfl:   Allergies  Allergen Reactions   Penicillins Hives    Has patient had a PCN reaction causing immediate rash, facial/tongue/throat swelling, SOB or lightheadedness with hypotension: no Has patient had a PCN reaction causing severe rash involving mucus membranes or skin necrosis: no Has patient had a PCN reaction that required hospitalization no Has patient had a PCN reaction occurring within the last 10 years: no If all of the above  answers are NO, then may proceed with Cephalosporin use.    Objective:   BP 124/82   Pulse 78   Temp 98.7 F (37.1 C)   Ht 5' (1.524 m)   Wt 141 lb 6 oz (64.1 kg)   SpO2 98%   BMI 27.61 kg/m      07/29/2024    8:12 AM 07/02/2024    4:10 PM 12/11/2023   11:27 AM  Vitals with BMI  Height 5' 0 5' 0 5' 1  Weight 141 lbs 6 oz 144 lbs 144 lbs  BMI 27.61 28.12 27.22  Systolic 124  115  Diastolic 82  72  Pulse 78  67     Physical Exam Vitals and nursing note reviewed.  Constitutional:      Appearance: Normal appearance. She is normal weight.  HENT:     Head: Normocephalic and atraumatic.     Right Ear: Tympanic membrane, ear canal and external ear normal.     Left Ear: Tympanic membrane, ear canal and external ear normal.     Nose: Nose normal.     Mouth/Throat:     Mouth: Mucous membranes are moist.     Pharynx: Oropharynx is clear.  Eyes:     Extraocular Movements: Extraocular movements intact.     Conjunctiva/sclera: Conjunctivae normal.     Pupils: Pupils are equal, round, and reactive to light.  Cardiovascular:     Rate and Rhythm: Normal rate and regular rhythm.     Pulses: Normal pulses.     Heart sounds: Normal heart sounds.  Pulmonary:     Effort: Pulmonary effort is normal.     Breath sounds: Normal breath sounds.  Abdominal:     General: Bowel sounds are normal.     Palpations: Abdomen is soft.  Musculoskeletal:        General: Normal range of motion.     Cervical back: Normal range of motion and neck supple.  Skin:    General: Skin is warm and dry.     Capillary Refill: Capillary refill takes less than 2 seconds.  Neurological:     General: No focal deficit present.     Mental Status: She is alert and oriented to person, place, and time. Mental status is at baseline.  Psychiatric:        Mood and Affect: Mood normal.        Behavior: Behavior normal.        Thought Content: Thought content normal.        Judgment: Judgment normal.      Assessment & Plan:  Physical exam, annual Assessment & Plan: Today your medical history was reviewed and routine physical exam with labs was performed. Recommend 150 minutes of moderate intensity exercise weekly and consuming a well-balanced diet. Advised to stop smoking if a smoker, avoid smoking if a non-smoker, limit alcohol consumption to 1 drink per day for women and 2 drinks per day for men, and  avoid illicit drug use. Counseled in mental health awareness and when to seek medical care. Vaccine maintenance discussed. Appropriate health maintenance items reviewed. Return to office in 1 year for annual physical exam. [name] Vent had a colonoscopy 1-2 years ago with [name] who recommended 5 year surveillance. Has had a partial hysterectomy with ovaries remaining. Mammogram UTD. Declines vaccines.   Orders: -     CBC with Differential/Platelet -     Comprehensive metabolic panel with GFR -     Lipid panel  Essential hypertension Assessment & Plan: Chronic well controlled on amlodipine  5mg  daily and hydrochlorothiazide  25mg  daily. Recommend heart healthy diet such as Mediterranean diet with whole grains, fruits, vegetable, fish, lean meats, nuts, and olive oil. Limit salt. Encouraged moderate walking, 3-5 times/week for 30-50 minutes each session. Aim for at least 150 minutes.week. Goal should be pace of 3 miles/hours, or walking 1.5 miles in 30 minutes. Avoid tobacco products. Avoid excess alcohol. Take medications as prescribed and bring medications and blood pressure log with cuff to each office visit. Seek medical care for chest pain, palpitations, shortness of breath with exertion, dizziness/lightheadedness, vision changes, recurrent headaches, or swelling of extremities. Fasting labs today, follow up in 6 months or sooner if needed  Orders: -     CBC with Differential/Platelet -     Comprehensive metabolic panel with GFR -     Lipid panel  Moderate mixed hyperlipidemia not  requiring statin therapy Assessment & Plan: Fasting labs today. I recommend consuming a heart healthy diet such as Mediterranean diet or DASH diet with whole grains, fruits, vegetable, fish, lean meats, nuts, and olive oil. Limit sweets and processed foods. I also encourage moderate intensity exercise 150 minutes weekly. This is 3-5 times weekly for 30-50 minutes each session. Goal should be pace of 3 miles/hours, or walking 1.5 miles in 30 minutes. The 10-year ASCVD risk score (Arnett DK, et al., 2019) is: 7.3%   Orders: -     CBC with Differential/Platelet -     Comprehensive metabolic panel with GFR -     Lipid panel  Chronic midline low back pain without sciatica Assessment & Plan: Chronic well controlled on Meloxicam . No radiculopathy.       Follow up plan: Return in about 6 months (around 01/27/2025) for chronic follow-up with labs 1 week prior.  Jeoffrey GORMAN Barrio, FNP

## 2024-07-29 NOTE — Assessment & Plan Note (Signed)
 Today your medical history was reviewed and routine physical exam with labs was performed. Recommend 150 minutes of moderate intensity exercise weekly and consuming a well-balanced diet. Advised to stop smoking if a smoker, avoid smoking if a non-smoker, limit alcohol consumption to 1 drink per day for women and 2 drinks per day for men, and avoid illicit drug use. Counseled in mental health awareness and when to seek medical care. Vaccine maintenance discussed. Appropriate health maintenance items reviewed. Return to office in 1 year for annual physical exam. [name] [name] had a colonoscopy 1-2 years ago with [facility] who recommended 5 year surveillance. Has had a partial hysterectomy with ovaries remaining. Mammogram UTD. Declines vaccines.

## 2024-07-29 NOTE — Assessment & Plan Note (Signed)
 Chronic well controlled on Meloxicam. No radiculopathy.

## 2024-07-29 NOTE — Assessment & Plan Note (Signed)
 Chronic well controlled on amlodipine  5mg  daily and hydrochlorothiazide  25mg  daily. Recommend heart healthy diet such as Mediterranean diet with whole grains, fruits, vegetable, fish, lean meats, nuts, and olive oil. Limit salt. Encouraged moderate walking, 3-5 times/week for 30-50 minutes each session. Aim for at least 150 minutes.week. Goal should be pace of 3 miles/hours, or walking 1.5 miles in 30 minutes. Avoid tobacco products. Avoid excess alcohol. Take medications as prescribed and bring medications and blood pressure log with cuff to each office visit. Seek medical care for chest pain, palpitations, shortness of breath with exertion, dizziness/lightheadedness, vision changes, recurrent headaches, or swelling of extremities. Fasting labs today, follow up in 6 months or sooner if needed

## 2024-07-29 NOTE — Progress Notes (Deleted)
   Subjective:  HPI: [name] [name] is a 63 y.o. female presenting on 07/29/2024 for No chief complaint on file.   HPI Patient is in today for ***  ROS  Relevant past medical history reviewed and updated as indicated.   Past Medical History:  Diagnosis Date   Hypertension    Parotid neoplasm    left     Past Surgical History:  Procedure Laterality Date   ABDOMINAL HYSTERECTOMY     partial   BUNIONECTOMY Right    PAROTIDECTOMY Left 08/29/2015   Procedure: Left neck Wound exploration bleeding, Evacuation of Hematoma;  Surgeon: Marlyce Finer, MD;  Location: [facility] OR;  Service: ENT;  Laterality: Left;   PAROTIDECTOMY Left 08/29/2015   Procedure: LEFT SUPERFICIAL PAROTIDECTOMY;  Surgeon: Marlyce Finer, MD;  Location: [facility];  Service: ENT;  Laterality: Left;    Allergies and medications reviewed and updated.   Current Outpatient Medications:    amLODipine  (NORVASC ) 5 MG tablet, Take 1 tablet (5 mg total) by mouth daily., Disp: 90 tablet, Rfl: 0   ELDERBERRY PO, , Disp: , Rfl:    hydrochlorothiazide  (HYDRODIURIL ) 25 MG tablet, Take 1 tablet (25 mg total) by mouth daily., Disp: 90 tablet, Rfl: 0   meloxicam  (MOBIC ) 15 MG tablet, Take 1 tablet (15 mg total) by mouth daily., Disp: 90 tablet, Rfl: 0   Multiple Vitamin (MULTIVITAMIN ADULT PO), 1 tablet, Disp: , Rfl:    Noni, Morinda citrifolia, (NONI PO), 1 capsule, Disp: , Rfl:    Omega-3 1000 MG CAPS, Take by mouth., Disp: , Rfl:    OVER THE COUNTER MEDICATION, Aller-tec, Disp: , Rfl:   Allergies  Allergen Reactions   Penicillins Hives    Has patient had a PCN reaction causing immediate rash, facial/tongue/throat swelling, SOB or lightheadedness with hypotension: {No} Has patient had a PCN reaction causing severe rash involving mucus membranes or skin necrosis: {No} Has patient had a PCN reaction that required hospitalization {No} Has patient had a PCN reaction occurring within the last 10 years: {No} If all  of the above answers are NO, then may proceed with Cephalosporin use.    Objective:   There were no vitals taken for this visit.     07/02/2024    4:10 PM 12/11/2023   11:27 AM 11/26/2023    8:06 AM  Vitals with BMI  Height 5' 0 5' 1 5' 1  Weight 144 lbs 144 lbs 146 lbs  BMI 28.12 27.22 27.6  Systolic  115 120  Diastolic  72 80  Pulse  67 67     Physical Exam  Assessment & Plan:  There are no diagnoses linked to this encounter.   Follow up plan: No follow-ups on file.  Jeoffrey GORMAN Barrio, FNP

## 2024-07-30 ENCOUNTER — Ambulatory Visit: Payer: Self-pay | Admitting: Family Medicine

## 2024-07-30 LAB — COMPREHENSIVE METABOLIC PANEL WITH GFR
AG Ratio: 1.6 (calc) (ref 1.0–2.5)
ALT: 22 U/L (ref 6–29)
AST: 9 U/L — ABNORMAL LOW (ref 10–35)
Albumin: 4.7 g/dL (ref 3.6–5.1)
Alkaline phosphatase (APISO): 89 U/L (ref 37–153)
BUN: 18 mg/dL (ref 7–25)
CO2: 29 mmol/L (ref 20–32)
Calcium: 10.2 mg/dL (ref 8.6–10.4)
Chloride: 104 mmol/L (ref 98–110)
Creat: 0.55 mg/dL (ref 0.50–1.05)
Globulin: 2.9 g/dL (ref 1.9–3.7)
Glucose, Bld: 89 mg/dL (ref 65–99)
Potassium: 4 mmol/L (ref 3.5–5.3)
Sodium: 142 mmol/L (ref 135–146)
Total Bilirubin: 0.3 mg/dL (ref 0.2–1.2)
Total Protein: 7.6 g/dL (ref 6.1–8.1)
eGFR: 102 mL/min/1.73m2 (ref >=60)

## 2024-07-30 LAB — LIPID PANEL
Cholesterol: 226 mg/dL — ABNORMAL HIGH (ref <200)
HDL: 47 mg/dL — ABNORMAL LOW (ref >=50)
LDL Cholesterol (Calc): 149 mg/dL — ABNORMAL HIGH
Non-HDL Cholesterol (Calc): 179 mg/dL — ABNORMAL HIGH (ref <130)
Total CHOL/HDL Ratio: 4.8 (calc) (ref <5.0)
Triglycerides: 161 mg/dL — ABNORMAL HIGH (ref <150)

## 2024-07-30 LAB — CBC WITH DIFFERENTIAL/PLATELET
Absolute Lymphocytes: 1238 {cells}/uL (ref 850–3900)
Absolute Monocytes: 369 {cells}/uL (ref 200–950)
Basophils Absolute: 28 {cells}/uL (ref 0–200)
Basophils Relative: 0.5 %
Eosinophils Absolute: 380 {cells}/uL (ref 15–500)
Eosinophils Relative: 6.9 %
HCT: 42.2 % (ref 35.0–45.0)
Hemoglobin: 14 g/dL (ref 11.7–15.5)
MCH: 30.2 pg (ref 27.0–33.0)
MCHC: 33.2 g/dL (ref 32.0–36.0)
MCV: 90.9 fL (ref 80.0–100.0)
MPV: 9.8 fL (ref 7.5–12.5)
Monocytes Relative: 6.7 %
Neutro Abs: 3487 {cells}/uL (ref 1500–7800)
Neutrophils Relative %: 63.4 %
Platelets: 341 Thousand/uL (ref 140–400)
RBC: 4.64 Million/uL (ref 3.80–5.10)
RDW: 12.8 % (ref 11.0–15.0)
Total Lymphocyte: 22.5 %
WBC: 5.5 Thousand/uL (ref 3.8–10.8)

## 2024-09-01 DIAGNOSIS — Z791 Long term (current) use of non-steroidal anti-inflammatories (NSAID): Secondary | ICD-10-CM | POA: Diagnosis not present

## 2024-09-01 DIAGNOSIS — M1990 Unspecified osteoarthritis, unspecified site: Secondary | ICD-10-CM | POA: Diagnosis not present

## 2024-09-01 DIAGNOSIS — E785 Hyperlipidemia, unspecified: Secondary | ICD-10-CM | POA: Diagnosis not present

## 2024-09-01 DIAGNOSIS — I10 Essential (primary) hypertension: Secondary | ICD-10-CM | POA: Diagnosis not present

## 2024-09-01 DIAGNOSIS — Z8249 Family history of ischemic heart disease and other diseases of the circulatory system: Secondary | ICD-10-CM | POA: Diagnosis not present

## 2024-09-27 ENCOUNTER — Other Ambulatory Visit: Payer: Self-pay | Admitting: Family Medicine

## 2024-09-27 DIAGNOSIS — I10 Essential (primary) hypertension: Secondary | ICD-10-CM

## 2025-01-19 ENCOUNTER — Other Ambulatory Visit

## 2025-01-27 ENCOUNTER — Ambulatory Visit: Admitting: Family Medicine

## 2025-07-08 ENCOUNTER — Ambulatory Visit
